# Patient Record
Sex: Male | Born: 1945 | Race: White | Hispanic: No | Marital: Married | Smoking: Never smoker
Health system: Southern US, Community
[De-identification: ages and names within clinical notes are randomized; demographics above are authoritative.]

## PROBLEM LIST (undated history)

## (undated) DIAGNOSIS — M1990 Unspecified osteoarthritis, unspecified site: Secondary | ICD-10-CM

## (undated) DIAGNOSIS — I499 Cardiac arrhythmia, unspecified: Secondary | ICD-10-CM

## (undated) DIAGNOSIS — IMO0001 Reserved for inherently not codable concepts without codable children: Secondary | ICD-10-CM

## (undated) DIAGNOSIS — Z9289 Personal history of other medical treatment: Secondary | ICD-10-CM

## (undated) DIAGNOSIS — G709 Myoneural disorder, unspecified: Secondary | ICD-10-CM

## (undated) DIAGNOSIS — K220 Achalasia of cardia: Secondary | ICD-10-CM

## (undated) HISTORY — PX: LAPAROSCOPIC MYOTOMY: SUR779

## (undated) HISTORY — PX: OTHER SURGICAL HISTORY: SHX169

---

## 2001-03-07 ENCOUNTER — Ambulatory Visit (HOSPITAL_COMMUNITY): Admission: RE | Admit: 2001-03-07 | Discharge: 2001-03-07 | Payer: Self-pay | Admitting: *Deleted

## 2001-03-07 ENCOUNTER — Encounter (INDEPENDENT_AMBULATORY_CARE_PROVIDER_SITE_OTHER): Payer: Self-pay | Admitting: *Deleted

## 2002-09-12 DIAGNOSIS — K220 Achalasia of cardia: Secondary | ICD-10-CM

## 2002-09-12 HISTORY — DX: Achalasia of cardia: K22.0

## 2007-05-18 ENCOUNTER — Ambulatory Visit (HOSPITAL_COMMUNITY): Admission: RE | Admit: 2007-05-18 | Discharge: 2007-05-18 | Payer: Self-pay | Admitting: Cardiology

## 2011-01-25 NOTE — Op Note (Signed)
Richard Rogers, Richard Rogers            ACCOUNT NO.:  [id number]   MEDICAL RECORD NO.:  [id number]          PATIENT TYPE:  OIB   LOCATION:  2859                         FACILITY:  [facility]   PHYSICIAN:  Armanda Magic, M.D.     DATE OF BIRTH:  1946/02/25   DATE OF PROCEDURE:  05/18/2007  DATE OF DISCHARGE:                               OPERATIVE REPORT   REFERRING PHYSICIAN:  Dr. Kirby Funk.   PROCEDURE:  Direct-current cardioversion.   OPERATOR:  Armanda Magic, M.D.   INDICATIONS:  Atrial fibrillation.   COMPLICATIONS:  None.   INTRAVENOUS MEDICATIONS:  Pentothal 175 mg IV.   INDICATION:  This is a 65 year old male with recent onset of A fib.  He  is now status post greater than 4 weeks of therapeutic INR greater than  2 and now presents for cardioversion.   DESCRIPTION OF PROCEDURE:  The patient was brought to the day hospital  in the fasting, nonsedated state.  Informed consent was obtained.  The  patient was connected to continuous heart rate and pulse oximetry  monitoring and intermittent blood pressure monitoring.  Defibrillation  pads were placed on the anterior and posterior left chest and back and  anesthesia was administered.  After adequate anesthesia was obtained, a  synchronized 120-joule biphasic shock was delivered, which successfully  converted the patient to sinus rhythm.  The patient tolerated the  procedure without complications.  The patient was monitored for a while  and once fully awake and alert, was discharged to home.   ASSESSMENT:  1. Atrial fibrillation.  2. Systemic anticoagulation with therapeutic INR.  3. Successful cardioversion to normal sinus rhythm.   PLAN:  Discharged to home after fully awake.  Follow up with me in 2  weeks.      Armanda Magic, M.D.  Electronically Signed    TT/MEDQ  D:  05/18/2007  T:  05/18/2007  Job:  [id number]   cc:   Thora Lance, M.D.

## 2015-01-11 DIAGNOSIS — Z9289 Personal history of other medical treatment: Secondary | ICD-10-CM

## 2015-01-11 HISTORY — DX: Personal history of other medical treatment: Z92.89

## 2015-06-01 ENCOUNTER — Other Ambulatory Visit: Payer: Self-pay | Admitting: Orthopedic Surgery

## 2015-06-26 ENCOUNTER — Other Ambulatory Visit (HOSPITAL_COMMUNITY): Payer: Self-pay

## 2015-06-26 ENCOUNTER — Encounter (HOSPITAL_COMMUNITY)
Admission: RE | Admit: 2015-06-26 | Discharge: 2015-06-26 | Disposition: A | Discharge status: Home / Self Care | Payer: No Typology Code available for payment source | Source: Ambulatory Visit | Attending: Orthopedic Surgery | Admitting: Orthopedic Surgery

## 2015-06-26 ENCOUNTER — Ambulatory Visit (HOSPITAL_COMMUNITY)
Admission: RE | Admit: 2015-06-26 | Discharge: 2015-06-26 | Disposition: A | Discharge status: Home / Self Care | Payer: No Typology Code available for payment source | Source: Ambulatory Visit | Attending: Orthopedic Surgery | Admitting: Orthopedic Surgery

## 2015-06-26 ENCOUNTER — Encounter (HOSPITAL_COMMUNITY): Payer: Self-pay

## 2015-06-26 DIAGNOSIS — K220 Achalasia of cardia: Secondary | ICD-10-CM | POA: Insufficient documentation

## 2015-06-26 DIAGNOSIS — Z01818 Encounter for other preprocedural examination: Secondary | ICD-10-CM

## 2015-06-26 DIAGNOSIS — Z01812 Encounter for preprocedural laboratory examination: Secondary | ICD-10-CM | POA: Diagnosis not present

## 2015-06-26 DIAGNOSIS — I4891 Unspecified atrial fibrillation: Secondary | ICD-10-CM | POA: Insufficient documentation

## 2015-06-26 DIAGNOSIS — M1711 Unilateral primary osteoarthritis, right knee: Secondary | ICD-10-CM | POA: Insufficient documentation

## 2015-06-26 HISTORY — DX: Unspecified osteoarthritis, unspecified site: M19.90

## 2015-06-26 HISTORY — DX: Achalasia of cardia: K22.0

## 2015-06-26 HISTORY — DX: Myoneural disorder, unspecified: G70.9

## 2015-06-26 HISTORY — DX: Personal history of other medical treatment: Z92.89

## 2015-06-26 HISTORY — DX: Cardiac arrhythmia, unspecified: I49.9

## 2015-06-26 HISTORY — DX: Reserved for inherently not codable concepts without codable children: IMO0001

## 2015-06-26 LAB — URINALYSIS, ROUTINE W REFLEX MICROSCOPIC
BILIRUBIN URINE: NEGATIVE
GLUCOSE, UA: NEGATIVE mg/dL
HGB URINE DIPSTICK: NEGATIVE
Ketones, ur: NEGATIVE mg/dL
Leukocytes, UA: NEGATIVE
Nitrite: NEGATIVE
PROTEIN: NEGATIVE mg/dL
SPECIFIC GRAVITY, URINE: 1.014 (ref 1.005–1.030)
UROBILINOGEN UA: 0.2 mg/dL (ref 0.0–1.0)
pH: 6 (ref 5.0–8.0)

## 2015-06-26 LAB — COMPREHENSIVE METABOLIC PANEL
ALK PHOS: 54 U/L (ref 38–126)
ALT: 25 U/L (ref 17–63)
ANION GAP: 7 (ref 5–15)
AST: 33 U/L (ref 15–41)
Albumin: 4.3 g/dL (ref 3.5–5.0)
BILIRUBIN TOTAL: 1.7 mg/dL — AB (ref 0.3–1.2)
BUN: 12 mg/dL (ref 6–20)
CALCIUM: 9.2 mg/dL (ref 8.9–10.3)
CO2: 27 mmol/L (ref 22–32)
Chloride: 104 mmol/L (ref 101–111)
Creatinine, Ser: 1.03 mg/dL (ref 0.61–1.24)
GFR calc Af Amer: >60 mL/min (ref >60)
Glucose, Bld: 114 mg/dL — ABNORMAL HIGH (ref 65–99)
POTASSIUM: 4.3 mmol/L (ref 3.5–5.1)
Sodium: 138 mmol/L (ref 135–145)
TOTAL PROTEIN: 7 g/dL (ref 6.5–8.1)

## 2015-06-26 LAB — CBC WITH DIFFERENTIAL/PLATELET
BASOS PCT: 1 %
Basophils Absolute: 0.1 10*3/uL (ref 0.0–0.1)
Eosinophils Absolute: 0.1 10*3/uL (ref 0.0–0.7)
Eosinophils Relative: 1 %
HEMATOCRIT: 46 % (ref 39.0–52.0)
HEMOGLOBIN: 15.2 g/dL (ref 13.0–17.0)
Lymphocytes Relative: 25 %
Lymphs Abs: 1.9 10*3/uL (ref 0.7–4.0)
MCH: 30.4 pg (ref 26.0–34.0)
MCHC: 33 g/dL (ref 30.0–36.0)
MCV: 92 fL (ref 78.0–100.0)
MONOS PCT: 8 %
Monocytes Absolute: 0.7 10*3/uL (ref 0.1–1.0)
NEUTROS ABS: 5.1 10*3/uL (ref 1.7–7.7)
NEUTROS PCT: 65 %
Platelets: 193 10*3/uL (ref 150–400)
RBC: 5 MIL/uL (ref 4.22–5.81)
RDW: 12.8 % (ref 11.5–15.5)
WBC: 7.8 10*3/uL (ref 4.0–10.5)

## 2015-06-26 LAB — SURGICAL PCR SCREEN
MRSA, PCR: NEGATIVE
Staphylococcus aureus: NEGATIVE

## 2015-06-26 LAB — PROTIME-INR
INR: 1.12 (ref 0.00–1.49)
PROTHROMBIN TIME: 14.6 s (ref 11.6–15.2)

## 2015-06-26 LAB — APTT: aPTT: 35 seconds (ref 24–37)

## 2015-06-26 NOTE — Pre-Procedure Instructions (Signed)
Richard Rogers  06/26/2015     No Pharmacies Listed   Your procedure is scheduled on 07/06/2015  Report to [facility] Admitting at 6:45 A.M.  Call this number if you have problems the morning of surgery:  [phone]   Remember:  Do not eat food or drink liquids after midnight.  Take these medicines the morning of surgery with A SIP OF WATER : Metoprolol   Do not wear jewelry   Do not wear lotions, powders, or perfumes.  You may wear deodorant.    Men may shave face and neck.   Do not bring valuables to the hospital.   [facility] is not responsible for any belongings or valuables.  Contacts, dentures or bridgework may not be worn into surgery.  Leave your suitcase in the car.  After surgery it may be brought to your room.  For patients admitted to the hospital, discharge time will be determined by your treatment team.  Patients discharged the day of surgery will not be allowed to drive home.   Name and phone number of your driver:   With spouse Special instructions:  Special Instructions: [facility] - Preparing for Surgery  Before surgery, you can play an important role.  Because skin is not sterile, your skin needs to be as free of germs as possible.  You can reduce the number of germs on you skin by washing with CHG (chlorahexidine gluconate) soap before surgery.  CHG is an antiseptic cleaner which kills germs and bonds with the skin to continue killing germs even after washing.  Please DO NOT use if you have an allergy to CHG or antibacterial soaps.  If your skin becomes reddened/irritated stop using the CHG and inform your nurse when you arrive at Short Stay.  Do not shave (including legs and underarms) for at least 48 hours prior to the first CHG shower.  You may shave your face.  Please follow these instructions carefully:   1.  Shower with CHG Soap the night before surgery and the  morning of Surgery.  2.  If you choose to wash your hair, wash  your hair first as usual with your  normal shampoo.  3.  After you shampoo, rinse your hair and body thoroughly to remove the  Shampoo.  4.  Use CHG as you would any other liquid soap.  You can apply chg directly to the skin and wash gently with scrungie or a clean washcloth.  5.  Apply the CHG Soap to your body ONLY FROM THE NECK DOWN.    Do not use on open wounds or open sores.  Avoid contact with your eyes, ears, mouth and genitals (private parts).  Wash genitals (private parts)   with your normal soap.  6.  Wash thoroughly, paying special attention to the area where your surgery will be performed.  7.  Thoroughly rinse your body with warm water from the neck down.  8.  DO NOT shower/wash with your normal soap after using and rinsing off   the CHG Soap.  9.  Pat yourself dry with a clean towel.            10.  Wear clean pajamas.            11.  Place clean sheets on your bed the night of your first shower and do not sleep with pets.  Day of Surgery  Do not apply any lotions/deodorants the morning of surgery.  Please wear clean clothes to the hospital/surgery center.  Please read over the following fact sheets that you were given. Pain Booklet, Coughing and Deep Breathing, MRSA Information and Surgical Site Infection Prevention

## 2015-06-26 NOTE — Progress Notes (Signed)
Call to A. Zelenak,PA-C, reported pt. H/O achalasia.  She will review the chart.

## 2015-06-28 LAB — URINE CULTURE: Culture: 6000

## 2015-06-29 NOTE — Progress Notes (Signed)
Anesthesia Chart Review:  Pt is 69 year old male scheduled for R total knee arthroplasty on 07/06/2015 with Dr. Ronnie Derby.   PCP is Dr. Fulton Reek at [facility].   PMH includes: atrial fibrillation, achalasia. Never smoker. BMI 26.   Medications include: ASA, metoprolol.   Preoperative labs reviewed.    Chest x-ray 06/26/2015 reviewed. No active cardiopulmonary disease.   EKG will need to be obtained DOS.   Nuclear stress test 01/14/2015:  -Regional wall motion: reveals normal myocardial thickening and wall  Motion. LVEF= 53% -The overall quality of the study is fair.  -Artifacts noted: no -Left ventricular cavity: normal. -Perfusion Analysis: SPECT images demonstrate homogeneous tracer distribution throughout the myocardium.   Pt has clearance for surgery from Dr. Doy Hutching in telephone encounter dated 01/19/15 in [place].   If EKG acceptable DOS, I anticipate pt can proceed as scheduled.   Willeen Cass, FNP-BC [facility] Short Stay Surgical Center/Anesthesiology Phone: [phone] 06/29/2015 2:48 PM

## 2015-07-05 MED ORDER — SODIUM CHLORIDE 0.9 % IV SOLN: INTRAVENOUS | Status: DC

## 2015-07-05 MED ORDER — SODIUM CHLORIDE 0.9 % IV SOLN
1000.0000 mg | INTRAVENOUS | Status: DC
  Filled 2015-07-05: qty 10

## 2015-07-05 MED ORDER — CHLORHEXIDINE GLUCONATE 4 % EX LIQD: 60.0000 mL | Freq: Once | CUTANEOUS | Status: DC

## 2015-07-05 MED ORDER — CEFAZOLIN SODIUM-DEXTROSE 2-3 GM-% IV SOLR
2.0000 g | INTRAVENOUS | Status: AC
  Administered 2015-07-06: 2 g via INTRAVENOUS

## 2015-07-05 MED ORDER — BUPIVACAINE LIPOSOME 1.3 % IJ SUSP
20.0000 mL | Freq: Once | INTRAMUSCULAR | Status: DC
  Filled 2015-07-05 (×2): qty 20

## 2015-07-06 ENCOUNTER — Encounter (HOSPITAL_COMMUNITY): Payer: Self-pay | Admitting: Anesthesiology

## 2015-07-06 ENCOUNTER — Inpatient Hospital Stay (HOSPITAL_COMMUNITY): Payer: Non-veteran care | Admitting: Anesthesiology

## 2015-07-06 ENCOUNTER — Inpatient Hospital Stay (HOSPITAL_COMMUNITY): Payer: Non-veteran care | Admitting: Vascular Surgery

## 2015-07-06 ENCOUNTER — Inpatient Hospital Stay (HOSPITAL_COMMUNITY)
Admission: RE | Admit: 2015-07-06 | Discharge: 2015-07-07 | DRG: 470 | Disposition: A | Discharge status: Home Health | Payer: Non-veteran care | Source: Ambulatory Visit | Attending: Orthopedic Surgery | Admitting: Orthopedic Surgery

## 2015-07-06 ENCOUNTER — Encounter (HOSPITAL_COMMUNITY)
Admission: RE | Disposition: A | Discharge status: Home Health | Payer: Self-pay | Source: Ambulatory Visit | Attending: Orthopedic Surgery

## 2015-07-06 DIAGNOSIS — D62 Acute posthemorrhagic anemia: Secondary | ICD-10-CM | POA: Diagnosis not present

## 2015-07-06 DIAGNOSIS — M1711 Unilateral primary osteoarthritis, right knee: Secondary | ICD-10-CM | POA: Diagnosis present

## 2015-07-06 DIAGNOSIS — Z96659 Presence of unspecified artificial knee joint: Secondary | ICD-10-CM

## 2015-07-06 DIAGNOSIS — M25561 Pain in right knee: Secondary | ICD-10-CM | POA: Diagnosis present

## 2015-07-06 HISTORY — PX: TOTAL KNEE ARTHROPLASTY: SHX125

## 2015-07-06 LAB — CBC
HCT: 42.5 % (ref 39.0–52.0)
Hemoglobin: 14 g/dL (ref 13.0–17.0)
MCH: 30.2 pg (ref 26.0–34.0)
MCHC: 32.9 g/dL (ref 30.0–36.0)
MCV: 91.8 fL (ref 78.0–100.0)
PLATELETS: 191 10*3/uL (ref 150–400)
RBC: 4.63 MIL/uL (ref 4.22–5.81)
RDW: 12.6 % (ref 11.5–15.5)
WBC: 9.9 10*3/uL (ref 4.0–10.5)

## 2015-07-06 LAB — POCT I-STAT 4, (NA,K, GLUC, HGB,HCT)
Glucose, Bld: 107 mg/dL — ABNORMAL HIGH (ref 65–99)
HEMATOCRIT: 43 % (ref 39.0–52.0)
HEMOGLOBIN: 14.6 g/dL (ref 13.0–17.0)
Potassium: 4.2 mmol/L (ref 3.5–5.1)
SODIUM: 141 mmol/L (ref 135–145)

## 2015-07-06 LAB — CREATININE, SERUM: Creatinine, Ser: 0.91 mg/dL (ref 0.61–1.24)

## 2015-07-06 SURGERY — ARTHROPLASTY, KNEE, TOTAL
Anesthesia: Spinal | Site: Knee | Laterality: Right

## 2015-07-06 MED ORDER — ONDANSETRON HCL 4 MG/2ML IJ SOLN
INTRAMUSCULAR | Status: AC
  Filled 2015-07-06: qty 2

## 2015-07-06 MED ORDER — BUPIVACAINE-EPINEPHRINE (PF) 0.5% -1:200000 IJ SOLN
INTRAMUSCULAR | Status: AC
  Filled 2015-07-06: qty 30

## 2015-07-06 MED ORDER — 0.9 % SODIUM CHLORIDE (POUR BTL) OPTIME
TOPICAL | Status: DC | PRN
  Administered 2015-07-06: 1000 mL

## 2015-07-06 MED ORDER — SENNOSIDES-DOCUSATE SODIUM 8.6-50 MG PO TABS: 1.0000 | ORAL_TABLET | Freq: Every evening | ORAL | Status: DC | PRN

## 2015-07-06 MED ORDER — ROCURONIUM BROMIDE 50 MG/5ML IV SOLN
INTRAVENOUS | Status: AC
  Filled 2015-07-06: qty 1

## 2015-07-06 MED ORDER — LACTATED RINGERS IV SOLN
INTRAVENOUS | Status: DC | PRN
  Administered 2015-07-06 (×2): via INTRAVENOUS

## 2015-07-06 MED ORDER — DIPHENHYDRAMINE HCL 12.5 MG/5ML PO ELIX: 12.5000 mg | ORAL_SOLUTION | ORAL | Status: DC | PRN

## 2015-07-06 MED ORDER — PROPOFOL 500 MG/50ML IV EMUL
INTRAVENOUS | Status: DC | PRN
  Administered 2015-07-06: 50 ug/kg/min via INTRAVENOUS

## 2015-07-06 MED ORDER — SODIUM CHLORIDE 0.9 % IJ SOLN
INTRAMUSCULAR | Status: AC
  Filled 2015-07-06: qty 10

## 2015-07-06 MED ORDER — PROPOFOL 10 MG/ML IV BOLUS
INTRAVENOUS | Status: AC
  Filled 2015-07-06: qty 20

## 2015-07-06 MED ORDER — SODIUM CHLORIDE 0.9 % IR SOLN
Status: DC | PRN
  Administered 2015-07-06: 3000 mL

## 2015-07-06 MED ORDER — MIDAZOLAM HCL 5 MG/5ML IJ SOLN
INTRAMUSCULAR | Status: DC | PRN
  Administered 2015-07-06 (×2): 1 mg via INTRAVENOUS

## 2015-07-06 MED ORDER — BUPIVACAINE LIPOSOME 1.3 % IJ SUSP
INTRAMUSCULAR | Status: DC | PRN
  Administered 2015-07-06: 20 mL

## 2015-07-06 MED ORDER — FENTANYL CITRATE (PF) 250 MCG/5ML IJ SOLN
INTRAMUSCULAR | Status: AC
  Filled 2015-07-06: qty 5

## 2015-07-06 MED ORDER — LIDOCAINE HCL (CARDIAC) 20 MG/ML IV SOLN
INTRAVENOUS | Status: AC
  Filled 2015-07-06: qty 5

## 2015-07-06 MED ORDER — ENOXAPARIN SODIUM 30 MG/0.3ML ~~LOC~~ SOLN
30.0000 mg | Freq: Two times a day (BID) | SUBCUTANEOUS | Status: DC
  Administered 2015-07-07: 30 mg via SUBCUTANEOUS
  Filled 2015-07-06: qty 0.3

## 2015-07-06 MED ORDER — BUPIVACAINE HCL (PF) 0.75 % IJ SOLN
INTRAMUSCULAR | Status: DC | PRN
Start: 2015-07-06 — End: 2015-07-06
  Administered 2015-07-06: 2 mL via INTRATHECAL

## 2015-07-06 MED ORDER — HYDROMORPHONE HCL 1 MG/ML IJ SOLN: 1.0000 mg | INTRAMUSCULAR | Status: DC | PRN

## 2015-07-06 MED ORDER — ONDANSETRON HCL 4 MG PO TABS: 4.0000 mg | ORAL_TABLET | Freq: Four times a day (QID) | ORAL | Status: DC | PRN

## 2015-07-06 MED ORDER — METHOCARBAMOL 1000 MG/10ML IJ SOLN
500.0000 mg | Freq: Four times a day (QID) | INTRAVENOUS | Status: DC | PRN
  Filled 2015-07-06: qty 5

## 2015-07-06 MED ORDER — OXYCODONE HCL 5 MG PO TABS
5.0000 mg | ORAL_TABLET | ORAL | Status: DC | PRN
  Administered 2015-07-06 – 2015-07-07 (×3): 10 mg via ORAL
  Filled 2015-07-06 (×3): qty 2

## 2015-07-06 MED ORDER — PHENYLEPHRINE 40 MCG/ML (10ML) SYRINGE FOR IV PUSH (FOR BLOOD PRESSURE SUPPORT)
PREFILLED_SYRINGE | INTRAVENOUS | Status: AC
  Filled 2015-07-06: qty 10

## 2015-07-06 MED ORDER — SUCCINYLCHOLINE CHLORIDE 20 MG/ML IJ SOLN
INTRAMUSCULAR | Status: AC
  Filled 2015-07-06: qty 1

## 2015-07-06 MED ORDER — OXYCODONE HCL ER 10 MG PO T12A
10.0000 mg | EXTENDED_RELEASE_TABLET | Freq: Two times a day (BID) | ORAL | Status: DC
  Administered 2015-07-06 (×2): 10 mg via ORAL
  Filled 2015-07-06 (×2): qty 1

## 2015-07-06 MED ORDER — CEFAZOLIN SODIUM-DEXTROSE 2-3 GM-% IV SOLR
2.0000 g | Freq: Four times a day (QID) | INTRAVENOUS | Status: AC
Start: 2015-07-06 — End: 2015-07-06
  Administered 2015-07-06 (×2): 2 g via INTRAVENOUS
  Filled 2015-07-06 (×2): qty 50

## 2015-07-06 MED ORDER — PHENYLEPHRINE HCL 10 MG/ML IJ SOLN
INTRAMUSCULAR | Status: DC | PRN
  Administered 2015-07-06 (×3): 80 ug via INTRAVENOUS
  Administered 2015-07-06 (×2): 40 ug via INTRAVENOUS
  Administered 2015-07-06 (×2): 80 ug via INTRAVENOUS

## 2015-07-06 MED ORDER — METOCLOPRAMIDE HCL 5 MG PO TABS: 5.0000 mg | ORAL_TABLET | Freq: Three times a day (TID) | ORAL | Status: DC | PRN

## 2015-07-06 MED ORDER — LACTATED RINGERS IV SOLN: INTRAVENOUS | Status: DC

## 2015-07-06 MED ORDER — ALUM & MAG HYDROXIDE-SIMETH 200-200-20 MG/5ML PO SUSP: 30.0000 mL | ORAL | Status: DC | PRN

## 2015-07-06 MED ORDER — HYDROMORPHONE HCL 1 MG/ML IJ SOLN: 0.2500 mg | INTRAMUSCULAR | Status: DC | PRN

## 2015-07-06 MED ORDER — PROMETHAZINE HCL 25 MG/ML IJ SOLN: 6.2500 mg | INTRAMUSCULAR | Status: DC | PRN

## 2015-07-06 MED ORDER — PHENOL 1.4 % MT LIQD: 1.0000 | OROMUCOSAL | Status: DC | PRN

## 2015-07-06 MED ORDER — ONDANSETRON HCL 4 MG/2ML IJ SOLN
4.0000 mg | Freq: Four times a day (QID) | INTRAMUSCULAR | Status: DC | PRN
  Administered 2015-07-06: 4 mg via INTRAVENOUS
  Filled 2015-07-06: qty 2

## 2015-07-06 MED ORDER — DOCUSATE SODIUM 100 MG PO CAPS
100.0000 mg | ORAL_CAPSULE | Freq: Two times a day (BID) | ORAL | Status: DC
  Administered 2015-07-06 – 2015-07-07 (×3): 100 mg via ORAL
  Filled 2015-07-06 (×3): qty 1

## 2015-07-06 MED ORDER — TRANEXAMIC ACID 1000 MG/10ML IV SOLN
2000.0000 mg | INTRAVENOUS | Status: AC
  Administered 2015-07-06: 2000 mg via TOPICAL
  Filled 2015-07-06: qty 20

## 2015-07-06 MED ORDER — SODIUM CHLORIDE 0.9 % IV SOLN
INTRAVENOUS | Status: DC
  Administered 2015-07-06: 13:00:00 via INTRAVENOUS

## 2015-07-06 MED ORDER — CELECOXIB 200 MG PO CAPS
200.0000 mg | ORAL_CAPSULE | Freq: Two times a day (BID) | ORAL | Status: DC
  Filled 2015-07-06 (×2): qty 1

## 2015-07-06 MED ORDER — ACETAMINOPHEN 325 MG PO TABS: 650.0000 mg | ORAL_TABLET | Freq: Four times a day (QID) | ORAL | Status: DC | PRN

## 2015-07-06 MED ORDER — METOCLOPRAMIDE HCL 5 MG/ML IJ SOLN
5.0000 mg | Freq: Three times a day (TID) | INTRAMUSCULAR | Status: DC | PRN
  Filled 2015-07-06: qty 2

## 2015-07-06 MED ORDER — METHOCARBAMOL 500 MG PO TABS
500.0000 mg | ORAL_TABLET | Freq: Four times a day (QID) | ORAL | Status: DC | PRN
  Filled 2015-07-06: qty 1

## 2015-07-06 MED ORDER — METOPROLOL SUCCINATE ER 50 MG PO TB24
75.0000 mg | ORAL_TABLET | Freq: Every day | ORAL | Status: DC
  Administered 2015-07-07: 75 mg via ORAL
  Filled 2015-07-06 (×4): qty 1

## 2015-07-06 MED ORDER — MEPERIDINE HCL 25 MG/ML IJ SOLN: 6.2500 mg | INTRAMUSCULAR | Status: DC | PRN

## 2015-07-06 MED ORDER — SODIUM CHLORIDE 0.9 % IJ SOLN
INTRAMUSCULAR | Status: DC | PRN
  Administered 2015-07-06 (×2): 10 mL via INTRAVENOUS

## 2015-07-06 MED ORDER — FLEET ENEMA 7-19 GM/118ML RE ENEM: 1.0000 | ENEMA | Freq: Once | RECTAL | Status: DC | PRN

## 2015-07-06 MED ORDER — EPHEDRINE SULFATE 50 MG/ML IJ SOLN
INTRAMUSCULAR | Status: AC
  Filled 2015-07-06: qty 1

## 2015-07-06 MED ORDER — FENTANYL CITRATE (PF) 100 MCG/2ML IJ SOLN
INTRAMUSCULAR | Status: DC | PRN
  Administered 2015-07-06: 25 ug via INTRAVENOUS

## 2015-07-06 MED ORDER — ONDANSETRON HCL 4 MG/2ML IJ SOLN
INTRAMUSCULAR | Status: DC | PRN
  Administered 2015-07-06: 4 mg via INTRAVENOUS

## 2015-07-06 MED ORDER — BISACODYL 5 MG PO TBEC: 5.0000 mg | DELAYED_RELEASE_TABLET | Freq: Every day | ORAL | Status: DC | PRN

## 2015-07-06 MED ORDER — BUPIVACAINE-EPINEPHRINE 0.5% -1:200000 IJ SOLN
INTRAMUSCULAR | Status: DC | PRN
  Administered 2015-07-06 (×2): 30 mL

## 2015-07-06 MED ORDER — MIDAZOLAM HCL 2 MG/2ML IJ SOLN
INTRAMUSCULAR | Status: AC
  Filled 2015-07-06: qty 4

## 2015-07-06 MED ORDER — LACTATED RINGERS IV SOLN
INTRAVENOUS | Status: DC
  Administered 2015-07-06: 08:00:00 via INTRAVENOUS

## 2015-07-06 MED ORDER — ZOLPIDEM TARTRATE 5 MG PO TABS: 5.0000 mg | ORAL_TABLET | Freq: Every evening | ORAL | Status: DC | PRN

## 2015-07-06 MED ORDER — MENTHOL 3 MG MT LOZG: 1.0000 | LOZENGE | OROMUCOSAL | Status: DC | PRN

## 2015-07-06 MED ORDER — ACETAMINOPHEN 650 MG RE SUPP: 650.0000 mg | Freq: Four times a day (QID) | RECTAL | Status: DC | PRN

## 2015-07-06 SURGICAL SUPPLY — 62 items
BANDAGE ESMARK 6X9 LF (GAUZE/BANDAGES/DRESSINGS) ×1 IMPLANT
BLADE SAGITTAL 13X1.27X60 (BLADE) ×2 IMPLANT
BLADE SAGITTAL 13X1.27X60MM (BLADE) ×1
BLADE SAW SGTL 83.5X18.5 (BLADE) ×3 IMPLANT
BLADE SURG 10 STRL SS (BLADE) ×3 IMPLANT
BNDG ESMARK 6X9 LF (GAUZE/BANDAGES/DRESSINGS) ×3
BOWL SMART MIX CTS (DISPOSABLE) ×3 IMPLANT
CAPT KNEE TOTAL 3 ×3 IMPLANT
CEMENT BONE SIMPLEX SPEEDSET (Cement) ×6 IMPLANT
COVER SURGICAL LIGHT HANDLE (MISCELLANEOUS) ×3 IMPLANT
CUFF TOURNIQUET SINGLE 34IN LL (TOURNIQUET CUFF) ×3 IMPLANT
DECANTER SPIKE VIAL GLASS SM (MISCELLANEOUS) ×3 IMPLANT
DRAPE EXTREMITY T 121X128X90 (DRAPE) ×3 IMPLANT
DRAPE INCISE IOBAN 66X45 STRL (DRAPES) ×6 IMPLANT
DRAPE PROXIMA HALF (DRAPES) IMPLANT
DRAPE U-SHAPE 47X51 STRL (DRAPES) ×3 IMPLANT
DRSG ADAPTIC 3X8 NADH LF (GAUZE/BANDAGES/DRESSINGS) ×3 IMPLANT
DRSG PAD ABDOMINAL 8X10 ST (GAUZE/BANDAGES/DRESSINGS) ×3 IMPLANT
DURAPREP 26ML APPLICATOR (WOUND CARE) ×6 IMPLANT
ELECT REM PT RETURN 9FT ADLT (ELECTROSURGICAL) ×3
ELECTRODE REM PT RTRN 9FT ADLT (ELECTROSURGICAL) ×1 IMPLANT
GAUZE SPONGE 4X4 12PLY STRL (GAUZE/BANDAGES/DRESSINGS) ×3 IMPLANT
GLOVE BIOGEL M 7.0 STRL (GLOVE) ×6 IMPLANT
GLOVE BIOGEL PI IND STRL 7.5 (GLOVE) ×1 IMPLANT
GLOVE BIOGEL PI IND STRL 8 (GLOVE) ×1 IMPLANT
GLOVE BIOGEL PI IND STRL 8.5 (GLOVE) ×2 IMPLANT
GLOVE BIOGEL PI INDICATOR 7.5 (GLOVE) ×2
GLOVE BIOGEL PI INDICATOR 8 (GLOVE) ×2
GLOVE BIOGEL PI INDICATOR 8.5 (GLOVE) ×4
GLOVE SURG ORTHO 8.0 STRL STRW (GLOVE) ×6 IMPLANT
GLOVE SURG SS PI 8.5 STRL IVOR (GLOVE) ×4
GLOVE SURG SS PI 8.5 STRL STRW (GLOVE) ×2 IMPLANT
GOWN STRL REUS W/ TWL LRG LVL3 (GOWN DISPOSABLE) ×1 IMPLANT
GOWN STRL REUS W/ TWL XL LVL3 (GOWN DISPOSABLE) ×2 IMPLANT
GOWN STRL REUS W/TWL LRG LVL3 (GOWN DISPOSABLE) ×2
GOWN STRL REUS W/TWL XL LVL3 (GOWN DISPOSABLE) ×4
HANDPIECE INTERPULSE COAX TIP (DISPOSABLE) ×2
HOOD PEEL AWAY FACE SHEILD DIS (HOOD) ×12 IMPLANT
KIT BASIN OR (CUSTOM PROCEDURE TRAY) ×3 IMPLANT
KIT ROOM TURNOVER OR (KITS) ×3 IMPLANT
KNEE CAPITATED TOTAL 3 ×1 IMPLANT
MANIFOLD NEPTUNE II (INSTRUMENTS) ×3 IMPLANT
NEEDLE 22X1 1/2 (OR ONLY) (NEEDLE) ×6 IMPLANT
NS IRRIG 1000ML POUR BTL (IV SOLUTION) ×3 IMPLANT
PACK TOTAL JOINT (CUSTOM PROCEDURE TRAY) ×3 IMPLANT
PACK UNIVERSAL I (CUSTOM PROCEDURE TRAY) ×3 IMPLANT
PAD ARMBOARD 7.5X6 YLW CONV (MISCELLANEOUS) ×6 IMPLANT
PADDING CAST COTTON 6X4 STRL (CAST SUPPLIES) ×3 IMPLANT
SET HNDPC FAN SPRY TIP SCT (DISPOSABLE) ×1 IMPLANT
STAPLER VISISTAT 35W (STAPLE) ×3 IMPLANT
SUCTION FRAZIER TIP 10 FR DISP (SUCTIONS) ×3 IMPLANT
SUT BONE WAX W31G (SUTURE) ×3 IMPLANT
SUT VIC AB 0 CTB1 27 (SUTURE) ×6 IMPLANT
SUT VIC AB 1 CT1 27 (SUTURE) ×4
SUT VIC AB 1 CT1 27XBRD ANBCTR (SUTURE) ×2 IMPLANT
SUT VIC AB 2-0 CT1 27 (SUTURE) ×4
SUT VIC AB 2-0 CT1 TAPERPNT 27 (SUTURE) ×2 IMPLANT
SYR 20CC LL (SYRINGE) ×6 IMPLANT
TOWEL OR 17X24 6PK STRL BLUE (TOWEL DISPOSABLE) ×3 IMPLANT
TOWEL OR 17X26 10 PK STRL BLUE (TOWEL DISPOSABLE) ×3 IMPLANT
TRAY CATH 16FR W/PLASTIC CATH (SET/KITS/TRAYS/PACK) ×3 IMPLANT
WATER STERILE IRR 1000ML POUR (IV SOLUTION) ×6 IMPLANT

## 2015-07-06 NOTE — Progress Notes (Signed)
SPORTS MEDICINE AND JOINT REPLACEMENT  Lara Mulch, MD   Carlynn Spry, PA-C [address], [place], [place]  [address]                             [phone]   PROGRESS NOTE  Subjective:  negative for Chest Pain  negative for Shortness of Breath  negative for Nausea/Vomiting   negative for Calf Pain  negative for Bowel Movement   Tolerating Diet: yes         Patient reports pain as 5 on 0-10 scale.    Objective: Vital signs in last 24 hours:   Patient Vitals for the past 24 hrs:  BP Temp Temp src Pulse Resp SpO2 Weight  07/06/15 1213 129/85 mmHg 97.5 F (36.4 C) Oral (!) 52 14 100 % -  07/06/15 1158 122/81 mmHg 97.5 F (36.4 C) - (!) 55 13 98 % -  07/06/15 1145 126/81 mmHg - - 76 13 97 % -  07/06/15 1130 115/83 mmHg - - 64 11 98 % -  07/06/15 1115 112/79 mmHg - - 79 14 96 % -  07/06/15 1103 115/84 mmHg 97.4 F (36.3 C) - 83 12 98 % -  07/06/15 0706 - - - - - - 92.08 kg (203 lb)  07/06/15 0704 (!) 148/94 mmHg 97.9 F (36.6 C) Oral (!) 102 20 99 % -    @flow {1959:LAST@   Intake/Output from previous day:       Intake/Output this shift:   10/24 0701 - 10/24 1900 In: 1200 [I.V.:1200] Out: 675 [Urine:600]   Intake/Output      10/23 0701 - 10/24 0700 10/24 0701 - 10/25 0700   I.V. (mL/kg)  1200 (13)   Total Intake(mL/kg)  1200 (13)   Urine (mL/kg/hr)  600 (0.6)   Blood  75 (0.1)   Total Output   675   Net   +525           LABORATORY DATA:  Recent Labs  07/06/15 0807 07/06/15 1504  WBC  --  9.9  HGB 14.6 14.0  HCT 43.0 42.5  PLT  --  191    Recent Labs  07/06/15 0807 07/06/15 1504  NA 141  --   K 4.2  --   CREATININE  --  0.91  GLUCOSE 107*  --    Lab Results  Component Value Date   INR 1.12 06/26/2015   INR 2.3* 05/18/2007    Examination:  General appearance: alert, cooperative and no distress Extremities: Homans sign is negative, no sign of DVT  Wound Exam: clean, dry, intact   Drainage:  None: wound tissue dry  Motor  Exam: EHL and FHL Intact  Sensory Exam: Deep Peroneal normal   Assessment:    Day of Surgery  Procedure(s) (LRB): RIGHT TOTAL KNEE ARTHROPLASTY (Right)  ADDITIONAL DIAGNOSIS:  Active Problems:   S/P total knee arthroplasty  Acute Blood Loss Anemia   Plan: Physical Therapy as ordered Weight Bearing as Tolerated (WBAT)  DVT Prophylaxis:  Lovenox  DISCHARGE PLAN: Home  DISCHARGE NEEDS: HHPT, CPM, Walker and 3-in-1 comode seat         Raelie Lohr 07/06/2015, 5:59 PM

## 2015-07-06 NOTE — Anesthesia Procedure Notes (Addendum)
Spinal Patient location during procedure: OR Start time: 07/06/2015 8:47 AM End time: 07/06/2015 8:49 AM Staffing Anesthesiologist: Suella Broad D Performed by: anesthesiologist  Preanesthetic Checklist Completed: patient identified, site marked, surgical consent, pre-op evaluation, timeout performed, IV checked, risks and benefits discussed and monitors and equipment checked Spinal Block Patient position: sitting Prep: ChloraPrep Patient monitoring: heart rate, continuous pulse ox, blood pressure and cardiac monitor Approach: midline Location: L4-5 Injection technique: single-shot Needle Needle type: Whitacre and Introducer  Needle gauge: 24 G Needle length: 9 cm Additional Notes Negative paresthesia. Negative blood return. Positive free-flowing CSF. Expiration date of kit checked and confirmed. Patient tolerated procedure well, without complications.    Procedure Name: MAC Date/Time: 07/06/2015 8:54 AM Performed by: Susa Loffler Pre-anesthesia Checklist: Patient identified, Emergency Drugs available, Suction available, Patient being monitored and Timeout performed Patient Re-evaluated:Patient Re-evaluated prior to inductionOxygen Delivery Method: Simple face mask Dental Injury: Teeth and Oropharynx as per pre-operative assessment

## 2015-07-06 NOTE — Transfer of Care (Signed)
Immediate Anesthesia Transfer of Care Note  Patient: Richard Rogers  Procedure(s) Performed: Procedure(s): RIGHT TOTAL KNEE ARTHROPLASTY (Right)  Patient Location: PACU  Anesthesia Type:MAC and Spinal  Level of Consciousness: awake, alert  and oriented  Airway & Oxygen Therapy: Patient Spontanous Breathing  Post-op Assessment: Report given to RN and Post -op Vital signs reviewed and stable  Post vital signs: Reviewed and stable  Last Vitals:  Filed Vitals:   07/06/15 0704  BP: 148/94  Pulse: 102  Temp: 36.6 C  Resp: 20    Complications: No apparent anesthesia complications

## 2015-07-06 NOTE — Anesthesia Preprocedure Evaluation (Addendum)
Anesthesia Evaluation  Patient identified by MRN, date of birth, ID band Patient awake    Reviewed: Allergy & Precautions, NPO status , Patient's Chart, lab work & pertinent test results, reviewed documented beta blocker date and time   Airway Mallampati: II  TM Distance: >3 FB Neck ROM: Full    Dental  (+) Teeth Intact   Pulmonary    breath sounds clear to auscultation       Cardiovascular + dysrhythmias Atrial Fibrillation  Rhythm:Irregular Rate:Abnormal     Neuro/Psych  Neuromuscular disease negative psych ROS   GI/Hepatic negative GI ROS, Neg liver ROS,   Endo/Other  negative endocrine ROS  Renal/GU negative Renal ROS  negative genitourinary   Musculoskeletal  (+) Arthritis , Osteoarthritis,    Abdominal   Peds  Hematology negative hematology ROS (+)   Anesthesia Other Findings   Reproductive/Obstetrics negative OB ROS                            Lab Results  Component Value Date   WBC 7.8 06/26/2015   HGB 15.2 06/26/2015   HCT 46.0 06/26/2015   MCV 92.0 06/26/2015   PLT 193 06/26/2015   Lab Results  Component Value Date   CREATININE 1.03 06/26/2015   BUN 12 06/26/2015   NA 138 06/26/2015   K 4.3 06/26/2015   CL 104 06/26/2015   CO2 27 06/26/2015   Lab Results  Component Value Date   INR 1.12 06/26/2015   INR 2.3* 05/18/2007   EKG: atrial fibrillation, rate 80's.  Anesthesia Physical Anesthesia Plan  ASA: II  Anesthesia Plan: Spinal   Post-op Pain Management:    Induction: Intravenous  Airway Management Planned: Natural Airway and Nasal Cannula  Additional Equipment:   Intra-op Plan:   Post-operative Plan:   Informed Consent: I have reviewed the patients History and Physical, chart, labs and discussed the procedure including the risks, benefits and alternatives for the proposed anesthesia with the patient or authorized representative who has indicated  his/her understanding and acceptance.   Dental advisory given  Plan Discussed with: CRNA  Anesthesia Plan Comments:         Anesthesia Quick Evaluation

## 2015-07-06 NOTE — Evaluation (Signed)
Physical Therapy Evaluation Patient Details Name: Richard Rogers MRN: [id number] DOB: Sep 14, 1945 Today's Date: 07/06/2015   History of Present Illness  69 y.o. male presented with a history of pain in the right knee now s/p Rt TKA. PMH: dysrhythmia (a.fib)  Clinical Impression  Pt is s/p TKA resulting in the deficits listed below (see PT Problem List). Pt will benefit from skilled PT to increase their independence and safety with mobility to allow discharge to home with assistance from spouse. Patient able to ambulate 12 feet with rw and min guard assistance, lacking active Rt ankle dorsiflexion at this time. Will progress as tolerated.        Follow Up Recommendations Home health PT;Supervision for mobility/OOB    Equipment Recommendations  None recommended by PT;Other (comment) (patient reports having rw at home)    Recommendations for Other Services       Precautions / Restrictions Precautions Precautions: Knee;Fall Precaution Booklet Issued: Yes (comment) Precaution Comments: HEP provided, reviewed no pillow under knee Restrictions Weight Bearing Restrictions: Yes RLE Weight Bearing: Weight bearing as tolerated      Mobility  Bed Mobility Overal bed mobility: Needs Assistance Bed Mobility: Supine to Sit     Supine to sit: HOB elevated;Min guard        Transfers Overall transfer level: Needs assistance Equipment used: Rolling walker (2 wheeled) Transfers: Sit to/from Stand Sit to Stand: Min guard         General transfer comment: cues for hand position  Ambulation/Gait Ambulation/Gait assistance: Min guard Ambulation Distance (Feet): 12 Feet Assistive device: Rolling walker (2 wheeled) Gait Pattern/deviations: Step-to pattern;Decreased dorsiflexion - right;Decreased stance time - right;Decreased weight shift to right Gait velocity: decerased   General Gait Details: No active dorsiflexion a this time on Rt.   Stairs            Wheelchair  Mobility    Modified Rankin (Stroke Patients Only)       Balance Overall balance assessment: Needs assistance Sitting-balance support: No upper extremity supported Sitting balance-Leahy Scale: Fair     Standing balance support: Bilateral upper extremity supported Standing balance-Leahy Scale: Poor Standing balance comment: using rw                             Pertinent Vitals/Pain Pain Assessment: 0-10 Pain Score: 3  Pain Location: Rt knee Pain Descriptors / Indicators: Sore Pain Intervention(s): Limited activity within patient's tolerance;Monitored during session;Ice applied    Home Living Family/patient expects to be discharged to:: Private residence Living Arrangements: Spouse/significant other Available Help at Discharge: Family;Available 24 hours/day Type of Home: House Home Access: Stairs to enter Entrance Stairs-Rails: Right Entrance Stairs-Number of Steps: 3 Home Layout: One level Home Equipment: Walker - 2 wheels      Prior Function Level of Independence: Independent               Hand Dominance        Extremity/Trunk Assessment               Lower Extremity Assessment: RLE deficits/detail RLE Deficits / Details: able to perform SLR       Communication   Communication: No difficulties  Cognition Arousal/Alertness: Awake/alert Behavior During Therapy: WFL for tasks assessed/performed Overall Cognitive Status: Within Functional Limits for tasks assessed                      General Comments  Exercises Total Joint Exercises Ankle Circles/Pumps: AROM;Both;10 reps Quad Sets: Strengthening;Right;10 reps Gluteal Sets: Strengthening;Both;10 reps      Assessment/Plan    PT Assessment Patient needs continued PT services  PT Diagnosis Difficulty walking   PT Problem List Decreased strength;Decreased range of motion;Decreased activity tolerance;Decreased balance;Decreased mobility;Pain  PT Treatment  Interventions DME instruction;Gait training;Stair training;Functional mobility training;Therapeutic activities;Therapeutic exercise;Balance training;Patient/family education   PT Goals (Current goals can be found in the Care Plan section) Acute Rehab PT Goals Patient Stated Goal: go home from hospital PT Goal Formulation: With patient Time For Goal Achievement: 07/20/15 Potential to Achieve Goals: Good    Frequency 7X/week   Barriers to discharge        Co-evaluation               End of Session Equipment Utilized During Treatment: Gait belt Activity Tolerance: Patient tolerated treatment well Patient left: in chair;with call bell/phone within reach;with family/visitor present;Other (comment) (in bone foam) Nurse Communication: Mobility status         Time: 8309-4076 PT Time Calculation (min) (ACUTE ONLY): 40 min   Charges:   PT Evaluation $Initial PT Evaluation Tier I: 1 Procedure PT Treatments $Therapeutic Activity: 23-37 mins   PT G Codes:        Cassell Clement, PT, CSCS Pager [phone] Office [phone]  07/06/2015, 3:35 PM

## 2015-07-06 NOTE — Progress Notes (Signed)
Orthopedic Tech Progress Note Patient Details:  [name] 07/17/1946 [id number]  CPM Right Knee CPM Right Knee: On Right Knee Flexion (Degrees): 90 Right Knee Extension (Degrees): 0 Additional Comments: trapeze bar patient helper Viewed order from doctor's order list  Hildred Priest 07/06/2015, 11:28 AM

## 2015-07-06 NOTE — Progress Notes (Signed)
Utilization review completed.  

## 2015-07-06 NOTE — Anesthesia Postprocedure Evaluation (Signed)
  Anesthesia Post-op Note  Patient: Richard Rogers  Procedure(s) Performed: Procedure(s): RIGHT TOTAL KNEE ARTHROPLASTY (Right)  Patient Location: PACU  Anesthesia Type:Spinal  Level of Consciousness: awake, alert  and oriented  Airway and Oxygen Therapy: Patient Spontanous Breathing  Post-op Pain: none  Post-op Assessment: Post-op Vital signs reviewed and Patient's Cardiovascular Status Stable LLE Motor Response: Purposeful movement (can bend knee up and put foot on bed)   RLE Motor Response: Purposeful movement, Responds to commands (wiggle toes) RLE Sensation: Tingling L Sensory Level:  (can feel footpump on bottom of foot, can't feel toes) R Sensory Level: L5-Outer lower leg, top of foot, great toe  Post-op Vital Signs: Reviewed and stable  Last Vitals:  Filed Vitals:   07/06/15 1213  BP: 129/85  Pulse: 52  Temp: 36.4 C  Resp: 14    Complications: No apparent anesthesia complications

## 2015-07-06 NOTE — Progress Notes (Signed)
Orthopedic Tech Progress Note Patient Details:  Richard Rogers [birth year] [id number] On cpm at 7:10 pm Patient ID: Richard Rogers, male   DOB: [birth year], 69 y.o.   MRN: [id number]   Richard Rogers 07/06/2015, 7:10 PM

## 2015-07-06 NOTE — H&P (Signed)
  Richard Rogers MRN:  [id number] DOB/SEX:  [birth year]/male  CHIEF COMPLAINT:  Painful right Knee  HISTORY: Patient is a 69 y.o. male presented with a history of pain in the right knee. Onset of symptoms was gradual starting several years ago with gradually worsening course since that time. Prior procedures on the knee include none. Patient has been treated conservatively with over-the-counter NSAIDs and activity modification. Patient currently rates pain in the knee at 10 out of 10 with activity. There is pain at night.  PAST MEDICAL HISTORY: There are no active problems to display for this patient.  Past Medical History  Diagnosis Date  . Achalasia of esophagus 2004    procedure for correction done at [facility]-    . Dysrhythmia     afib  . H/O cardiovascular stress test 01/2015    done in preparation for knee replacement surg.  Marland Kitchen Shortness of breath dyspnea     when in afib  . Neuromuscular disorder (HCC)     hamstrings, spasms- feet, legs - on occasion  . Arthritis     knee, + gout - foot     Past Surgical History  Procedure Laterality Date  . Laparoscopic myotomy    . Tooth implant      lower palate      MEDICATIONS:   No prescriptions prior to admission    ALLERGIES:  No Known Allergies  REVIEW OF SYSTEMS:  Pertinent items noted in HPI and remainder of comprehensive ROS otherwise negative.   FAMILY HISTORY:  No family history on file.  SOCIAL HISTORY:   Social History  Substance Use Topics  . Smoking status: Never Smoker   . Smokeless tobacco: Not on file  . Alcohol Use: Yes     Comment: wine- occasionally      EXAMINATION:  Vital signs in last 24 hours:    General appearance: alert, cooperative and no distress Lungs: clear to auscultation bilaterally Heart: regular rate and rhythm, S1, S2 normal, no murmur, click, rub or gallop Abdomen: soft, non-tender; bowel sounds normal; no masses,  no organomegaly Extremities: extremities normal, atraumatic, no  cyanosis or edema and Homans sign is negative, no sign of DVT Pulses: 2+ and symmetric Skin: Skin color, texture, turgor normal. No rashes or lesions Neurologic: Alert and oriented X 3, normal strength and tone. Normal symmetric reflexes. Normal coordination and gait  Musculoskeletal:  ROM 0-110, Ligaments intact,  Imaging Review Plain radiographs demonstrate severe degenerative joint disease of the left knee. The overall alignment is significant varus. The bone quality appears to be good for age and reported activity level.  Assessment/Plan: Primary osteoarthritis, left knee   The patient history, physical examination and imaging studies are consistent with advanced degenerative joint disease of the left knee. The patient has failed conservative treatment.  The clearance notes were reviewed.  After discussion with the patient it was felt that Total Knee Replacement was indicated. The procedure,  risks, and benefits of total knee arthroplasty were presented and reviewed. The risks including but not limited to aseptic loosening, infection, blood clots, vascular injury, stiffness, patella tracking problems complications among others were discussed. The patient acknowledged the explanation, agreed to proceed with the plan.  Nzinga Ferran 07/06/2015, 6:32 AM

## 2015-07-07 ENCOUNTER — Encounter (HOSPITAL_COMMUNITY): Payer: Self-pay | Admitting: Orthopedic Surgery

## 2015-07-07 LAB — CBC
HEMATOCRIT: 38.4 % — AB (ref 39.0–52.0)
Hemoglobin: 12.6 g/dL — ABNORMAL LOW (ref 13.0–17.0)
MCH: 30 pg (ref 26.0–34.0)
MCHC: 32.8 g/dL (ref 30.0–36.0)
MCV: 91.4 fL (ref 78.0–100.0)
Platelets: 169 10*3/uL (ref 150–400)
RBC: 4.2 MIL/uL — AB (ref 4.22–5.81)
RDW: 12.6 % (ref 11.5–15.5)
WBC: 8.1 10*3/uL (ref 4.0–10.5)

## 2015-07-07 LAB — BASIC METABOLIC PANEL
Anion gap: 8 (ref 5–15)
BUN: 10 mg/dL (ref 6–20)
CALCIUM: 8.5 mg/dL — AB (ref 8.9–10.3)
CO2: 28 mmol/L (ref 22–32)
Chloride: 99 mmol/L — ABNORMAL LOW (ref 101–111)
Creatinine, Ser: 1.07 mg/dL (ref 0.61–1.24)
GFR calc Af Amer: >60 mL/min (ref >60)
GLUCOSE: 115 mg/dL — AB (ref 65–99)
Potassium: 4.1 mmol/L (ref 3.5–5.1)
Sodium: 135 mmol/L (ref 135–145)

## 2015-07-07 MED ORDER — ENOXAPARIN SODIUM 40 MG/0.4ML ~~LOC~~ SOLN: 40.0000 mg | SUBCUTANEOUS | Status: AC

## 2015-07-07 MED ORDER — OXYCODONE HCL ER 10 MG PO T12A: 10.0000 mg | EXTENDED_RELEASE_TABLET | Freq: Two times a day (BID) | ORAL | Status: AC

## 2015-07-07 MED ORDER — METHOCARBAMOL 500 MG PO TABS: 500.0000 mg | ORAL_TABLET | Freq: Four times a day (QID) | ORAL | Status: AC | PRN

## 2015-07-07 MED ORDER — OXYCODONE HCL 5 MG PO TABS: 5.0000 mg | ORAL_TABLET | ORAL | Status: AC | PRN

## 2015-07-07 NOTE — Progress Notes (Signed)
SPORTS MEDICINE AND JOINT REPLACEMENT  Lara Mulch, MD   Carlynn Spry, PA-C [address], [place], [place]  [address]                             [phone]   PROGRESS NOTE  Subjective:  negative for Chest Pain  negative for Shortness of Breath  negative for Nausea/Vomiting   negative for Calf Pain  negative for Bowel Movement   Tolerating Diet: yes         Patient reports pain as 4 on 0-10 scale.    Objective: Vital signs in last 24 hours:   Patient Vitals for the past 24 hrs:  BP Temp Temp src Pulse Resp SpO2 Height Weight  07/07/15 0541 125/84 mmHg 98.9 F (37.2 C) Oral (!) 110 16 100 % - -  07/06/15 2118 122/78 mmHg 98.5 F (36.9 C) Oral 82 16 98 % - -  07/06/15 1818 - - - - - - 6\' 2"  (1.88 m) 92.08 kg (203 lb)  07/06/15 1213 129/85 mmHg 97.5 F (36.4 C) Oral (!) 52 14 100 % - -  07/06/15 1158 122/81 mmHg 97.5 F (36.4 C) - (!) 55 13 98 % - -  07/06/15 1145 126/81 mmHg - - 76 13 97 % - -  07/06/15 1130 115/83 mmHg - - 64 11 98 % - -  07/06/15 1115 112/79 mmHg - - 79 14 96 % - -  07/06/15 1103 115/84 mmHg 97.4 F (36.3 C) - 83 12 98 % - -    @flow {1959:LAST@   Intake/Output from previous day:   10/24 0701 - 10/25 0700 In: 1560 [P.O.:360; I.V.:1200] Out: 1675 [Urine:1600]   Intake/Output this shift:       Intake/Output      10/24 0701 - 10/25 0700 10/25 0701 - 10/26 0700   P.O. 360    I.V. (mL/kg) 1200 (13)    Total Intake(mL/kg) 1560 (16.9)    Urine (mL/kg/hr) 1600 (0.7)    Blood 75 (0)    Total Output 1675     Net -115             LABORATORY DATA:  Recent Labs  07/06/15 0807 07/06/15 1504 07/07/15 0610  WBC  --  9.9 8.1  HGB 14.6 14.0 12.6*  HCT 43.0 42.5 38.4*  PLT  --  191 169    Recent Labs  07/06/15 0807 07/06/15 1504 07/07/15 0610  NA 141  --  135  K 4.2  --  4.1  CL  --   --  99*  CO2  --   --  28  BUN  --   --  10  CREATININE  --  0.91 1.07  GLUCOSE 107*  --  115*  CALCIUM  --   --  8.5*   Lab Results   Component Value Date   INR 1.12 06/26/2015   INR 2.3* 05/18/2007    Examination:  General appearance: alert, cooperative and no distress Extremities: Homans sign is negative, no sign of DVT  Wound Exam: clean, dry, intact   Drainage:  None: wound tissue dry  Motor Exam: EHL and FHL Intact  Sensory Exam: Deep Peroneal normal   Assessment:    1 Day Post-Op  Procedure(s) (LRB): RIGHT TOTAL KNEE ARTHROPLASTY (Right)  ADDITIONAL DIAGNOSIS:  Active Problems:   S/P total knee arthroplasty  Acute Blood Loss Anemia   Plan: Physical Therapy as ordered Weight  Bearing as Tolerated (WBAT)  DVT Prophylaxis:  Lovenox  DISCHARGE PLAN: Home  DISCHARGE NEEDS: HHPT, CPM, Walker and 3-in-1 comode seat         Graclyn Lawther 07/07/2015, 7:30 AM

## 2015-07-07 NOTE — Progress Notes (Signed)
Physical Therapy Treatment Patient Details Name: Richard Rogers MRN: [id number] DOB: 12/31/1945 Today's Date: 07/07/2015    History of Present Illness 69 y.o. male presented with a history of pain in the right knee now s/p Rt TKA. PMH: dysrhythmia (a.fib)    PT Comments    Pt able to ambulate stairs today with SPTA and spouse. Pt displayed signs/communicated of lack of confidence in WB on RLE during stair training. However, when pt grabbed handrail with BUE, he stated he was much more comfortable with ascending and descending steps. Reviewed HEP with pt. with no notable signs of distress.    Follow Up Recommendations  Home health PT;Supervision for mobility/OOB     Equipment Recommendations  None recommended by PT;Other (comment)       Precautions / Restrictions Precautions Precautions: Knee;Fall Precaution Comments: HEP reviewed, reviewed no pillow under knee Restrictions Weight Bearing Restrictions: Yes RLE Weight Bearing: Weight bearing as tolerated    Mobility                    Transfers Overall transfer level: Needs assistance Equipment used: Rolling walker (2 wheeled) Transfers: Sit to/from Stand Sit to Stand: Min guard         General transfer comment: Pt required increased time for transfers due to stiffness.  Ambulation/Gait Ambulation/Gait assistance: Min guard Ambulation Distance (Feet): 200 Feet Assistive device: Rolling walker (2 wheeled) Gait Pattern/deviations: Step-to pattern;Antalgic;Trunk flexed;Decreased weight shift to right;Decreased stride length;Decreased stance time - right Gait velocity: decerased Gait velocity interpretation: <1.8 ft/sec, indicative of risk for recurrent falls (0.39 f/s) General Gait Details: Pt had slight increase in dorsiflexion during gait training   Stairs Stairs: Yes Stairs assistance: +2 safety/equipment (For safety while descending stairs.) Stair Management: One rail Left;One rail Right;Step to  pattern (Rail on L going inside home, rail on R going outside home.) Number of Stairs: 3 General stair comments: Pt required min A with stair training to increase safety and balance. Spouse trained in positioning and hand placement while ascending and decending stairs. Pt educated on going up with the LLE and down with the RLE. Pt very apprehensive to WB on RLE while decending stairs.         Balance Overall balance assessment: Needs assistance         Standing balance support: During functional activity;No upper extremity supported Standing balance-Leahy Scale: Fair Standing balance comment: Pt able to stand at top of the stairs with no UE support for a short period of time.                     Cognition Arousal/Alertness: Awake/alert Behavior During Therapy: WFL for tasks assessed/performed Overall Cognitive Status: Within Functional Limits for tasks assessed                      Exercises Total Joint Exercises Ankle Circles/Pumps: AROM;Both;20 reps;Seated Quad Sets: Strengthening;Right;10 reps;Other (comment);Seated (cues for 5 sec hold) Towel Squeeze: Strengthening;Both;10 reps;Other (comment);Seated (cues for 5 sec hold) Heel Slides: AROM;Right;10 reps;Seated Goniometric ROM: 9-85 degrees of flexion (long sitting in recliner-AROM)        Pertinent Vitals/Pain Pain Assessment: 0-10 Pain Score: 5  Pain Location: Rt knee Pain Descriptors / Indicators: Aching;Sore Pain Intervention(s): Monitored during session;Patient requesting pain meds-RN notified           PT Goals (current goals can now be found in the care plan section) Acute Rehab PT Goals Patient Stated Goal: none stated  PT Goal Formulation: With patient Time For Goal Achievement: 07/20/15 Potential to Achieve Goals: Good Progress towards PT goals: Progressing toward goals    Frequency  7X/week    PT Plan Current plan remains appropriate       End of Session Equipment Utilized  During Treatment: Gait belt Activity Tolerance: Patient tolerated treatment well Patient left: in chair;with call bell/phone within reach;with family/visitor present;Other (comment)     Time: 0174-9449 PT Time Calculation (min) (ACUTE ONLY): 60 min  Charges:    3 Gait  Time, [place] [phone] OFFICE  07/07/2015, 12:00 PM

## 2015-07-07 NOTE — Evaluation (Signed)
Occupational Therapy Evaluation Patient Details Name: Richard Rogers MRN: [id number] DOB: 21-May-1946 Today's Date: 07/07/2015    History of Present Illness 69 y.o. male presented with a history of pain in the right knee now s/p Rt TKA. PMH: dysrhythmia (a.fib)   Clinical Impression   Patient is s/p R TKA surgery resulting in functional limitations due to the deficits listed below (see OT problem list). PTA independent with all adls and iadls. Pt reports minimal pain and very pleased with ability to ambulate already. Patient will benefit from skilled OT acutely to increase independence and safety with ADLS to allow discharge Home without follow up. Pt has all necessary DME. OT to continue to practice and educate on shower transfer to decr fall risk.     Follow Up Recommendations  No OT follow up    Equipment Recommendations  None recommended by OT    Recommendations for Other Services       Precautions / Restrictions Precautions Precautions: Knee;Fall Restrictions RLE Weight Bearing: Weight bearing as tolerated      Mobility Bed Mobility Overal bed mobility: Needs Assistance Bed Mobility: Supine to Sit     Supine to sit: HOB elevated;Min guard     General bed mobility comments: Pt using BIL UE to (A) R LE toward EOB. pt able to progress without physical (A)  Transfers Overall transfer level: Needs assistance Equipment used: Rolling walker (2 wheeled) Transfers: Sit to/from Stand Sit to Stand: Min guard         General transfer comment: Pt needs incr time - first time up this morning. Reports leg feels "stiff"    Balance Overall balance assessment: Needs assistance Sitting-balance support: Feet supported;No upper extremity supported Sitting balance-Leahy Scale: Good     Standing balance support: Single extremity supported;During functional activity Standing balance-Leahy Scale: Fair Standing balance comment: leaning against sink with abdomen to  balance during adl task. Required one uE support                             ADL Overall ADL's : Needs assistance/impaired Eating/Feeding: Independent   Grooming: Oral care;Min guard;Standing   Upper Body Bathing: Modified independent;Sitting   Lower Body Bathing: Min guard;Sit to/from stand Lower Body Bathing Details (indicate cue type and reason): pt able to reach bil ankles so no AE required         Toilet Transfer: Min Psychiatric nurse Details (indicate cue type and reason): pt has 3n1 for home     Tub/ Shower Transfer: Minimal assistance;Shower seat;Ambulation;Rolling walker Tub/Shower Transfer Details (indicate cue type and reason): pt educated with x2 return demo of shower transfer. no family present. Pt  reports "i am a litte fuzzy" Pt return to sitting in recliner with R LE fully extended with zero foam pillow Functional mobility during ADLs: Min guard;Rolling walker General ADL Comments: Pt educated on gradual incr in CPM when d/c home. pt currently able to reach 0/70 this AM. pt educated on adls at sink level and dressing LB with R TKA.      Vision Vision Assessment?: No apparent visual deficits   Perception     Praxis      Pertinent Vitals/Pain       Hand Dominance Right   Extremity/Trunk Assessment Upper Extremity Assessment Upper Extremity Assessment: Overall WFL for tasks assessed   Lower Extremity Assessment Lower Extremity Assessment: Defer to PT evaluation   Cervical / Trunk Assessment Cervical / Trunk  Assessment: Normal   Communication Communication Communication: No difficulties   Cognition Arousal/Alertness: Awake/alert Behavior During Therapy: WFL for tasks assessed/performed Overall Cognitive Status: Within Functional Limits for tasks assessed                     General Comments       Exercises       Shoulder Instructions      Home Living Family/patient expects to be discharged to:: Private  residence Living Arrangements: Spouse/significant other Available Help at Discharge: Family;Available 24 hours/day Type of Home: House Home Access: Stairs to enter [employer] of Steps: 3 Entrance Stairs-Rails: Right Home Layout: One level     Bathroom Shower/Tub: Occupational psychologist: Standard     Home Equipment: Environmental consultant - 2 wheels;Bedside commode;Adaptive equipment;Cane - single point;Shower seat - built in (hurrricane cane) [employer]: Reacher        Prior Functioning/Environment Level of Independence: Independent             OT Diagnosis: Acute pain   OT Problem List: Decreased strength;Decreased activity tolerance;Impaired balance (sitting and/or standing);Decreased safety awareness;Decreased knowledge of use of DME or AE;Decreased knowledge of precautions;Pain   OT Treatment/Interventions: Self-care/ADL training;Therapeutic exercise;DME and/or AE instruction;Therapeutic activities;Patient/family education;Balance training    OT Goals(Current goals can be found in the care plan section) Acute Rehab OT Goals Patient Stated Goal: to return to playing golf OT Goal Formulation: With patient Time For Goal Achievement: 07/21/15 Potential to Achieve Goals: Good  OT Frequency: Min 2X/week   Barriers to D/C:            Co-evaluation              End of Session Equipment Utilized During Treatment: Gait belt;Rolling walker CPM Right Knee CPM Right Knee: On Right Knee Flexion (Degrees): 0 Right Knee Extension (Degrees): 60 Additional Comments: [phone]  Activity Tolerance: Patient tolerated treatment well Patient left: in chair;with call bell/phone within reach   Time: [phone] ([phone]) OT Time Calculation (min): 31 min Charges:  OT General Charges $OT Visit: 1 Procedure OT Evaluation $Initial OT Evaluation Tier I: 1 Procedure OT Treatments $Self Care/Home Management : 23-37 mins G-Codes:    Peri Maris 2015/07/24,  8:56 AM  Pager: [phone]

## 2015-07-07 NOTE — Discharge Instructions (Signed)

## 2015-07-07 NOTE — Op Note (Signed)
TOTAL KNEE REPLACEMENT OPERATIVE NOTE:  07/06/2015  1:08 PM  PATIENT:  Richard Rogers  69 y.o. male  PRE-OPERATIVE DIAGNOSIS:  primary osteoarthritis right knee  POST-OPERATIVE DIAGNOSIS:  primary osteoarthritis right knee  PROCEDURE:  Procedure(s): RIGHT TOTAL KNEE ARTHROPLASTY  SURGEON:  Surgeon(s): Vickey Huger, MD  PHYSICIAN ASSISTANT: Carlynn Spry, [facility]  ANESTHESIA:   general  DRAINS: Hemovac  SPECIMEN: None  COUNTS:  Correct  TOURNIQUET:   Total Tourniquet Time Documented: Thigh (Right) - 55 minutes Total: Thigh (Right) - 55 minutes   DICTATION:  Indication for procedure:    The patient is a 69 y.o. male who has failed conservative treatment for primary osteoarthritis right knee.  Informed consent was obtained prior to anesthesia. The risks versus benefits of the operation were explain and in a way the patient can, and did, understand.   On the implant demand matching protocol, this patient scored 10.  Therefore, this patient did" "did not receive a polyethylene insert with vitamin E which is a high demand implant.  Description of procedure:     The patient was taken to the operating room and placed under anesthesia.  The patient was positioned in the usual fashion taking care that all body parts were adequately padded and/or protected.  I foley catheter was not placed.  A tourniquet was applied and the leg prepped and draped in the usual sterile fashion.  The extremity was exsanguinated with the esmarch and tourniquet inflated to 350 mmHg.  Pre-operative range of motion was normal.  The knee was in 5 degree of mild varus.  A midline incision approximately 6-7 inches long was made with a #10 blade.  A new blade was used to make a parapatellar arthrotomy going 2-3 cm into the quadriceps tendon, over the patella, and alongside the medial aspect of the patellar tendon.  A synovectomy was then performed with the #10 blade and forceps. I then elevated the deep MCL  off the medial tibial metaphysis subperiosteally around to the semimembranosus attachment.    I everted the patella and used calipers to measure patellar thickness.  I used the reamer to ream down to appropriate thickness to recreate the native thickness.  I then removed excess bone with the rongeur and sagittal saw.  I used the appropriately sized template and drilled the three lug holes.  I then put the trial in place and measured the thickness with the calipers to ensure recreation of the native thickness.  The trial was then removed and the patella subluxed and the knee brought into flexion.  A homan retractor was place to retract and protect the patella and lateral structures.  A Z-retractor was place medially to protect the medial structures.  The extra-medullary alignment system was used to make cut the tibial articular surface perpendicular to the anamotic axis of the tibia and in 3 degrees of posterior slope.  The cut surface and alignment jig was removed.  I then used the intramedullary alignment guide to make a 6 valgus cut on the distal femur.  I then marked out the epicondylar axis on the distal femur.  The posterior condylar axis measured 3 degrees.  I then used the anterior referencing sizer and measured the femur to be a size 11.  The 4-In-1 cutting block was screwed into place in external rotation matching the posterior condylar angle, making our cuts perpendicular to the epicondylar axis.  Anterior, posterior and chamfer cuts were made with the sagittal saw.  The cutting block and cut  pieces were removed.  A lamina spreader was placed in 90 degrees of flexion.  The ACL, PCL, menisci, and posterior condylar osteophytes were removed.  A 12 mm spacer blocked was found to offer good flexion and extension gap balance after minimal in degree releasing.   The scoop retractor was then placed and the femoral finishing block was pinned in place.  The small sagittal saw was used as well as the lug  drill to finish the femur.  The block and cut surfaces were removed and the medullary canal hole filled with autograft bone from the cut pieces.  The tibia was delivered forward in deep flexion and external rotation.  A size G tray was selected and pinned into place centered on the medial 1/3 of the tibial tubercle.  The reamer and keel was used to prepare the tibia through the tray.    I then trialed with the size 11 femur, size G tibia, a 12 mm insert and the 38 patella.  I had excellent flexion/extension gap balance, excellent patella tracking.  Flexion was full and beyond 120 degrees; extension was zero.  These components were chosen and the staff opened them to me on the back table while the knee was lavaged copiously and the cement mixed.  The soft tissue was infiltrated with 60cc of exparel 1.3% through a 21 gauge needle.  I cemented in the components and removed all excess cement.  The polyethylene tibial component was snapped into place and the knee placed in extension while cement was hardening.  The capsule was infilltrated with 30cc of .25% Marcaine with epinephrine.  A hemovac was place in the joint exiting superolaterally.  A pain pump was place superomedially superficial to the arthrotomy.  Once the cement was hard, the tourniquet was let down.  Hemostasis was obtained.  The arthrotomy was closed with figure-8 #1 vicryl sutures.  The deep soft tissues were closed with #0 vicryls and the subcuticular layer closed with a running #2-0 vicryl.  The skin was reapproximated and closed with skin staples.  The wound was dressed with xeroform, 4 x4's, 2 ABD sponges, a single layer of webril and a TED stocking.   The patient was then awakened, extubated, and taken to the recovery room in stable condition.  BLOOD LOSS:  300cc DRAINS: 1 hemovac, 1 pain catheter COMPLICATIONS:  None.  PLAN OF CARE: Admit to inpatient   PATIENT DISPOSITION:  PACU - hemodynamically stable.   Delay start of  Pharmacological VTE agent (>24hrs) due to surgical blood loss or risk of bleeding:  not applicable  Please fax a copy of this op note to my office at [phone] (please only include page 1 and 2 of the Case Information op note)

## 2015-07-07 NOTE — Care Management Note (Addendum)
Case Management Note  Patient Details  Name: KOLLIN UDELL MRN: [id number] Date of Birth: 1946-09-10  Subjective/Objective:         S/p right total knee arthroplasty           Action/Plan: Set up with [facility] for HHPT by MD office. Spoke with patient, no change in discharge plan. Patient stated that  Linda has delivered a CPM, rolling walker and 3N1 to his home. Patient stated that his wife will be able to assist him after discharge.   Expected Discharge Date:                  Expected Discharge Plan:  [place]  In-House Referral:  NA  Discharge planning Services  CM Consult  Post Acute Care Choice:  Durable Medical Equipment, Home Health Choice offered to:  Patient  DME Arranged:  3-N-1, CPM, Walker rolling DME Agency:  Other - Comment  HH Arranged:  PT HH Agency:  [place]  Status of Service:  Completed, signed off  Medicare Important Message Given:    Date Medicare IM Given:    Medicare IM give by:    Date Additional Medicare IM Given:    Additional Medicare Important Message give by:     If discussed at [place] of Stay Meetings, dates discussed:    Additional Comments:  Nila Nephew, RN 07/07/2015, 11:15 AM

## 2015-07-24 NOTE — Discharge Summary (Signed)
SPORTS MEDICINE & JOINT REPLACEMENT   Lara Mulch, MD   Carlynn Spry, PA-C [place], [place], [place]  [address]                             [phone]  PATIENT ID: Richard Rogers        MRN:  [id number]          DOB/AGE: 1945/11/12 / 69 y.o.    DISCHARGE SUMMARY  ADMISSION DATE:    07/06/2015 DISCHARGE DATE:   07/07/2015  ADMISSION DIAGNOSIS: primary osteoarthritis right knee    DISCHARGE DIAGNOSIS:  primary osteoarthritis right knee    ADDITIONAL DIAGNOSIS: Active Problems:   S/P total knee arthroplasty  Past Medical History  Diagnosis Date  . Achalasia of esophagus 2004    procedure for correction done at [facility]-    . Dysrhythmia     afib  . H/O cardiovascular stress test 01/2015    done in preparation for knee replacement surg.  Marland Kitchen Shortness of breath dyspnea     when in afib  . Neuromuscular disorder (HCC)     hamstrings, spasms- feet, legs - on occasion  . Arthritis     knee, + gout - foot      PROCEDURE: Procedure(s): RIGHT TOTAL KNEE ARTHROPLASTY on 07/06/2015  CONSULTS:     HISTORY:  See H&P in chart  HOSPITAL COURSE:  MESHILEM SHURN is a 69 y.o. admitted on 07/06/2015 and found to have a diagnosis of primary osteoarthritis right knee.  After appropriate laboratory studies were obtained  they were taken to the operating room on 07/06/2015 and underwent Procedure(s): RIGHT TOTAL KNEE ARTHROPLASTY.   They were given perioperative antibiotics:  Anti-infectives    Start     Dose/Rate Route Frequency Ordered Stop   07/06/15 1500  ceFAZolin (ANCEF) IVPB 2 g/50 mL premix     2 g 100 mL/hr over 30 Minutes Intravenous Every 6 hours 07/06/15 1225 07/06/15 2256   07/06/15 0600  ceFAZolin (ANCEF) IVPB 2 g/50 mL premix     2 g 100 mL/hr over 30 Minutes Intravenous On call to O.R. 07/05/15 1443 07/06/15 0900    .  Tolerated the procedure well.  Placed with a foley intraoperatively.  Given Ofirmev at induction and for 48 hours.    POD# 1:  Vital signs were stable.  Patient denied Chest pain, shortness of breath, or calf pain.  Patient was started on Lovenox 30 mg subcutaneously twice daily at 8am.  Consults to PT, OT, and care management were made.  The patient was weight bearing as tolerated.  CPM was placed on the operative leg 0-90 degrees for 6-8 hours a day.  Incentive spirometry was taught.  Dressing was changed.  Hemovac was discontinued.      POD #2, Continued  PT for ambulation and exercise program.  IV saline locked.  O2 discontinued.    The remainder of the hospital course was dedicated to ambulation and strengthening.   The patient was discharged on post op day 1 in  Good condition.  Blood products given:none  DIAGNOSTIC STUDIES: Recent vital signs: No data found.      Recent laboratory studies: No results for input(s): WBC, HGB, HCT, PLT in the last 168 hours. No results for input(s): NA, K, CL, CO2, BUN, CREATININE, GLUCOSE, CALCIUM in the last 168 hours. Lab Results  Component Value Date   INR 1.12 06/26/2015   INR  2.3* 05/18/2007     Recent Radiographic Studies :  Dg Chest 2 View  06/26/2015  CLINICAL DATA:  69 year old male -pre operative respiratory examination for right knee surgery. EXAM: CHEST  2 VIEW COMPARISON:  05/18/2007 chest radiograph FINDINGS: The cardiomediastinal silhouette is unremarkable. There is no evidence of focal airspace disease, pulmonary edema, suspicious pulmonary nodule/mass, pleural effusion, or pneumothorax. No acute bony abnormalities are identified. IMPRESSION: No active cardiopulmonary disease. Electronically Signed   By: Margarette Canada M.D.   On: 06/26/2015 14:25    DISCHARGE INSTRUCTIONS: Discharge Instructions    CPM    Complete by:  As directed   Continuous passive motion machine (CPM):      Use the CPM from 0 to 90 for 6-8 hours per day.      You may increase by 10 per day.  You may break it up into 2 or 3 sessions per day.      Use CPM for 2 weeks or until you are  told to stop.     Call MD / Call 911    Complete by:  As directed   If you experience chest pain or shortness of breath, CALL 911 and be transported to the hospital emergency room.  If you develope a fever above 101 F, pus (white drainage) or increased drainage or redness at the wound, or calf pain, call your surgeon's office.     Change dressing    Complete by:  As directed   Change dressing on Wednesday, then change the dressing daily with sterile 4 x 4 inch gauze dressing and apply TED hose or ace wrap.     Constipation Prevention    Complete by:  As directed   Drink plenty of fluids.  Prune juice may be helpful.  You may use a stool softener, such as Colace (over the counter) 100 mg twice a day.  Use MiraLax (over the counter) for constipation as needed.     Diet - low sodium heart healthy    Complete by:  As directed      Do not put a pillow under the knee. Place it under the heel.    Complete by:  As directed      Driving restrictions    Complete by:  As directed   No driving for 6 weeks     Increase activity slowly as tolerated    Complete by:  As directed      Lifting restrictions    Complete by:  As directed   No lifting for 6 weeks     TED hose    Complete by:  As directed   Use stockings (TED hose) for 2 weeks on both leg(s).  You may remove them at night for sleeping.           DISCHARGE MEDICATIONS:     Medication List    STOP taking these medications        aspirin EC 81 MG tablet      TAKE these medications        CHERRY PO  Take 1 tablet by mouth daily.     cyanocobalamin 500 MCG tablet  Take 500 mcg by mouth daily.     enoxaparin 40 MG/0.4ML injection  Commonly known as:  LOVENOX  Inject 0.4 mLs (40 mg total) into the skin daily.     FLAX SEED OIL PO  Take 1 tablet by mouth daily.     GINGER PO  Take 1 tablet  by mouth daily.     MAGNESIUM CITRATE PO  Take 1 each by mouth daily.     methocarbamol 500 MG tablet  Commonly known as:  ROBAXIN   Take 1-2 tablets (500-1,000 mg total) by mouth every 6 (six) hours as needed for muscle spasms.     metoprolol succinate 100 MG 24 hr tablet  Commonly known as:  TOPROL-XL  Take 75 mg by mouth daily. Take with or immediately following a meal.     oxyCODONE 5 MG immediate release tablet  Commonly known as:  Oxy IR/ROXICODONE  Take 1-2 tablets (5-10 mg total) by mouth every 3 (three) hours as needed for breakthrough pain.     oxyCODONE 10 mg 12 hr tablet  Commonly known as:  oxyCODONE  Take 1 tablet (10 mg total) by mouth every 12 (twelve) hours.     TURMERIC PO  Take 1 tablet by mouth daily.     Vitamin D 2000 UNITS Caps  Take 2,000 Units by mouth daily.        FOLLOW UP VISIT:       Follow-up Information    Follow up with [facility].   Why:  They will contact you to schedule home therapy visits.   Contact information:   [address] [place] [address] [phone]       Follow up with Rudean Haskell, MD. Call on 07/21/2015.   Specialty:  Orthopedic Surgery   Contact information:   [place] [address] [phone]       DISPOSITION: HOME   CONDITION:  Good   Zeki Bedrosian 07/24/2015, 5:26 PM

## 2017-01-06 ENCOUNTER — Encounter: Payer: Self-pay | Admitting: *Deleted

## 2017-01-09 ENCOUNTER — Ambulatory Visit
Admission: RE | Admit: 2017-01-09 | Discharge: 2017-01-09 | Disposition: A | Discharge status: Home / Self Care | Payer: Medicare HMO | Source: Ambulatory Visit | Attending: Unknown Physician Specialty | Admitting: Unknown Physician Specialty

## 2017-01-09 ENCOUNTER — Ambulatory Visit: Payer: Medicare HMO | Admitting: Anesthesiology

## 2017-01-09 ENCOUNTER — Encounter: Payer: Self-pay | Admitting: *Deleted

## 2017-01-09 ENCOUNTER — Encounter
Admission: RE | Disposition: A | Discharge status: Home / Self Care | Payer: Self-pay | Source: Ambulatory Visit | Attending: Unknown Physician Specialty

## 2017-01-09 DIAGNOSIS — M109 Gout, unspecified: Secondary | ICD-10-CM | POA: Insufficient documentation

## 2017-01-09 DIAGNOSIS — K220 Achalasia of cardia: Secondary | ICD-10-CM | POA: Diagnosis not present

## 2017-01-09 DIAGNOSIS — K219 Gastro-esophageal reflux disease without esophagitis: Secondary | ICD-10-CM | POA: Diagnosis not present

## 2017-01-09 DIAGNOSIS — K640 First degree hemorrhoids: Secondary | ICD-10-CM | POA: Diagnosis not present

## 2017-01-09 DIAGNOSIS — K5730 Diverticulosis of large intestine without perforation or abscess without bleeding: Secondary | ICD-10-CM | POA: Insufficient documentation

## 2017-01-09 DIAGNOSIS — D123 Benign neoplasm of transverse colon: Secondary | ICD-10-CM | POA: Insufficient documentation

## 2017-01-09 DIAGNOSIS — Z79899 Other long term (current) drug therapy: Secondary | ICD-10-CM | POA: Diagnosis not present

## 2017-01-09 DIAGNOSIS — G709 Myoneural disorder, unspecified: Secondary | ICD-10-CM | POA: Insufficient documentation

## 2017-01-09 DIAGNOSIS — Z96651 Presence of right artificial knee joint: Secondary | ICD-10-CM | POA: Diagnosis not present

## 2017-01-09 DIAGNOSIS — I4891 Unspecified atrial fibrillation: Secondary | ICD-10-CM | POA: Insufficient documentation

## 2017-01-09 DIAGNOSIS — Z1211 Encounter for screening for malignant neoplasm of colon: Secondary | ICD-10-CM | POA: Insufficient documentation

## 2017-01-09 DIAGNOSIS — M1990 Unspecified osteoarthritis, unspecified site: Secondary | ICD-10-CM | POA: Diagnosis not present

## 2017-01-09 HISTORY — PX: COLONOSCOPY WITH PROPOFOL: SHX5780

## 2017-01-09 SURGERY — COLONOSCOPY WITH PROPOFOL
Anesthesia: General

## 2017-01-09 MED ORDER — LIDOCAINE HCL (PF) 2 % IJ SOLN
INTRAMUSCULAR | Status: DC | PRN
  Administered 2017-01-09: 50 mg

## 2017-01-09 MED ORDER — SODIUM CHLORIDE 0.9 % IV SOLN
INTRAVENOUS | Status: DC
Start: 2017-01-09 — End: 2017-01-09
  Administered 2017-01-09: 11:00:00 via INTRAVENOUS

## 2017-01-09 MED ORDER — PROPOFOL 500 MG/50ML IV EMUL
INTRAVENOUS | Status: DC | PRN
  Administered 2017-01-09: 50 ug/kg/min via INTRAVENOUS

## 2017-01-09 MED ORDER — MIDAZOLAM HCL 2 MG/2ML IJ SOLN
INTRAMUSCULAR | Status: AC
  Filled 2017-01-09: qty 2

## 2017-01-09 MED ORDER — FENTANYL CITRATE (PF) 100 MCG/2ML IJ SOLN
INTRAMUSCULAR | Status: AC
  Filled 2017-01-09: qty 2

## 2017-01-09 MED ORDER — MIDAZOLAM HCL 5 MG/5ML IJ SOLN
INTRAMUSCULAR | Status: DC | PRN
  Administered 2017-01-09 (×2): 1 mg via INTRAVENOUS

## 2017-01-09 MED ORDER — SODIUM CHLORIDE 0.9 % IV SOLN: INTRAVENOUS | Status: DC

## 2017-01-09 MED ORDER — PROPOFOL 10 MG/ML IV BOLUS
INTRAVENOUS | Status: DC | PRN
  Administered 2017-01-09: 10 mg via INTRAVENOUS
  Administered 2017-01-09: 30 mg via INTRAVENOUS

## 2017-01-09 MED ORDER — PROPOFOL 10 MG/ML IV BOLUS
INTRAVENOUS | Status: AC
  Filled 2017-01-09: qty 20

## 2017-01-09 MED ORDER — FENTANYL CITRATE (PF) 100 MCG/2ML IJ SOLN
INTRAMUSCULAR | Status: DC | PRN
  Administered 2017-01-09 (×2): 25 ug via INTRAVENOUS
  Administered 2017-01-09: 50 ug via INTRAVENOUS

## 2017-01-09 MED ORDER — PIPERACILLIN-TAZOBACTAM 3.375 G IVPB 30 MIN
3.3750 g | Freq: Once | INTRAVENOUS | Status: AC
  Administered 2017-01-09: 3.375 g via INTRAVENOUS
  Filled 2017-01-09: qty 50

## 2017-01-09 NOTE — Anesthesia Post-op Follow-up Note (Cosign Needed)
Anesthesia QCDR form completed.        

## 2017-01-09 NOTE — Op Note (Signed)
[facility] Patient Name: Richard Rogers Procedure Date: 01/09/2017 11:18 AM MRN: [id number] Account #: [id number] Date of Birth: 1946/07/20 Admit Type: Outpatient Age: 71 Room: [facility] ENDO ROOM 1 Gender: Male Note Status: Finalized Procedure:            Colonoscopy Indications:          Screening for colorectal malignant neoplasm Providers:            Manya Silvas, MD Referring MD:         Leonie Draydon. Doy Hutching, MD (Referring MD) Medicines:            Propofol per Anesthesia Complications:        No immediate complications. Procedure:            Pre-Anesthesia Assessment:                       - After reviewing the risks and benefits, the patient                        was deemed in satisfactory condition to undergo the                        procedure.                       After obtaining informed consent, the colonoscope was                        passed under direct vision. Throughout the procedure,                        the patient's blood pressure, pulse, and oxygen                        saturations were monitored continuously. The                        Colonoscope was introduced through the anus and                        advanced to the the cecum, identified by appendiceal                        orifice and ileocecal valve. The colonoscopy was                        performed without difficulty. The patient tolerated the                        procedure well. The quality of the bowel preparation                        was excellent. Findings:      A small polyp was found in the hepatic flexure. The polyp was sessile.       The polyp was removed with a hot snare. Resection and retrieval were       complete. To prevent bleeding after the polypectomy, one hemostatic clip       was successfully placed. There was no bleeding during, or at the end, of       the procedure.  A diminutive polyp was found in the hepatic flexure. The polyp  was       sessile. The polyp was removed with a cold snare. Resection and       retrieval were complete.      Multiple small and large-mouthed diverticula were found in the sigmoid       colon and descending colon.      Internal hemorrhoids were found during endoscopy. The hemorrhoids were       small and Grade I (internal hemorrhoids that do not prolapse).      The exam was otherwise without abnormality. Impression:           - One small polyp at the hepatic flexure, removed with                        a hot snare. Resected and retrieved. Clip was placed.                       - One diminutive polyp at the hepatic flexure, removed                        with a cold snare. Resected and retrieved.                       - Diverticulosis in the sigmoid colon and in the                        descending colon.                       - Internal hemorrhoids.                       - The examination was otherwise normal. Recommendation:       - Await pathology results. Manya Silvas, MD 01/09/2017 11:48:58 AM This report has been signed electronically. Number of Addenda: 0 Note Initiated On: 01/09/2017 11:18 AM Scope Withdrawal Time: 0 hours 6 minutes 18 seconds  Total Procedure Duration: 0 hours 17 minutes 48 seconds       [facility]

## 2017-01-09 NOTE — Anesthesia Postprocedure Evaluation (Signed)
Anesthesia Post Note  Patient: Richard Rogers  Procedure(s) Performed: Procedure(s) (LRB): COLONOSCOPY WITH PROPOFOL (N/A)  Patient location during evaluation: Endoscopy Anesthesia Type: General Level of consciousness: awake and alert Pain management: pain level controlled Vital Signs Assessment: post-procedure vital signs reviewed and stable Respiratory status: spontaneous breathing, nonlabored ventilation, respiratory function stable and patient connected to nasal cannula oxygen Cardiovascular status: blood pressure returned to baseline and stable Postop Assessment: no signs of nausea or vomiting Anesthetic complications: no     Last Vitals:  Vitals:   01/09/17 1209 01/09/17 1219  BP: 108/85 113/89  Pulse: 75 75  Resp: 16 15  Temp:      Last Pain:  Vitals:   01/09/17 1149  TempSrc: Tympanic                 Martha Clan

## 2017-01-09 NOTE — H&P (Signed)
Primary Care Physician:  Idelle Crouch, MD Primary Gastroenterologist:  Dr. Vira Agar  Pre-Procedure History & Physical: HPI:  Richard Rogers is a 71 y.o. male is here for an colonoscopy.   Past Medical History:  Diagnosis Date  . Achalasia of esophagus 2004   procedure for correction done at [facility]-    . Arthritis    knee, + gout - foot    . Dysrhythmia    afib  . H/O cardiovascular stress test 01/2015   done in preparation for knee replacement surg.  Marland Kitchen Neuromuscular disorder (HCC)    hamstrings, spasms- feet, legs - on occasion  . Shortness of breath dyspnea    when in afib    Past Surgical History:  Procedure Laterality Date  . LAPAROSCOPIC MYOTOMY    . tooth implant     lower palate   . TOTAL KNEE ARTHROPLASTY Right 07/06/2015   Procedure: RIGHT TOTAL KNEE ARTHROPLASTY;  Surgeon: Vickey Huger, MD;  Location: [place];  Service: Orthopedics;  Laterality: Right;    Prior to Admission medications   Medication Sig Start Date End Date Taking? Authorizing Provider  CHERRY PO Take 1 tablet by mouth daily.   Yes Historical Provider, MD  Cholecalciferol (VITAMIN D) 2000 UNITS CAPS Take 2,000 Units by mouth daily.   Yes Historical Provider, MD  cyanocobalamin 500 MCG tablet Take 500 mcg by mouth daily.   Yes Historical Provider, MD  Flaxseed, Linseed, (FLAX SEED OIL PO) Take 1 tablet by mouth daily.   Yes Historical Provider, MD  Ginger, Zingiber officinalis, (GINGER PO) Take 1 tablet by mouth daily.   Yes Historical Provider, MD  methocarbamol (ROBAXIN) 500 MG tablet Take 1-2 tablets (500-1,000 mg total) by mouth every 6 (six) hours as needed for muscle spasms. 07/07/15  Yes Carlynn Spry, PA-C  metoprolol succinate (TOPROL-XL) 100 MG 24 hr tablet Take 75 mg by mouth daily. Take with or immediately following a meal.   Yes Historical Provider, MD  TURMERIC PO Take 1 tablet by mouth daily.   Yes Historical Provider, MD  enoxaparin (LOVENOX) 40 MG/0.4ML injection Inject 0.4 mLs (40  mg total) into the skin daily. Patient not taking: Reported on 01/09/2017 07/07/15   Carlynn Spry, PA-C  MAGNESIUM CITRATE PO Take 1 each by mouth daily.    Historical Provider, MD  oxyCODONE (OXY IR/ROXICODONE) 5 MG immediate release tablet Take 1-2 tablets (5-10 mg total) by mouth every 3 (three) hours as needed for breakthrough pain. Patient not taking: Reported on 01/09/2017 07/07/15   Carlynn Spry, PA-C  OxyCODONE (OXYCONTIN) 10 mg T12A 12 hr tablet Take 1 tablet (10 mg total) by mouth every 12 (twelve) hours. Patient not taking: Reported on 01/09/2017 07/07/15   Carlynn Spry, PA-C    Allergies as of 11/04/2016  . (No Known Allergies)    History reviewed. No pertinent family history.  Social History   Social History  . Marital status: Married    Spouse name: N/A  . Number of children: N/A  . Years of education: N/A   Occupational History  . Not on file.   Social History Main Topics  . Smoking status: Never Smoker  . Smokeless tobacco: Never Used  . Alcohol use Yes     Comment: wine- occasionally   . Drug use: No  . Sexual activity: Not on file   Other Topics Concern  . Not on file   Social History Narrative  . No narrative on file    Review of Systems:  See HPI, otherwise negative ROS  Physical Exam: BP 131/76   Pulse 74   Temp (!) 96.9 F (36.1 C) (Tympanic)   Resp 16   Ht 6\' 2"  (1.88 m)   Wt 86.9 kg (191 lb 8 oz)   SpO2 97%   BMI 24.59 kg/m  General:   Alert,  pleasant and cooperative in NAD Head:  Normocephalic and atraumatic. Neck:  Supple; no masses or thyromegaly. Lungs:  Clear throughout to auscultation.    Heart:  Regular rate and rhythm. Abdomen:  Soft, nontender and nondistended. Normal bowel sounds, without guarding, and without rebound.   Neurologic:  Alert and  oriented x4;  grossly normal neurologically.  Impression/Plan: Richard Rogers is here for an colonoscopy to be performed for screening colonoscopy  Risks, benefits,  limitations, and alternatives regarding  colonoscopy have been reviewed with the patient.  Questions have been answered.  All parties agreeable.   Gaylyn Cheers, MD  01/09/2017, 11:18 AM

## 2017-01-09 NOTE — Transfer of Care (Signed)
Immediate Anesthesia Transfer of Care Note  Patient: Richard Rogers  Procedure(s) Performed: Procedure(s): COLONOSCOPY WITH PROPOFOL (N/A)  Patient Location: PACU  Anesthesia Type:General  Level of Consciousness: sedated  Airway & Oxygen Therapy: Patient Spontanous Breathing and Patient connected to nasal cannula oxygen  Post-op Assessment: Report given to RN and Post -op Vital signs reviewed and stable  Post vital signs: Reviewed and stable  Last Vitals:  Vitals:   01/09/17 1028  BP: 131/76  Pulse: 74  Resp: 16  Temp: (!) 36.1 C    Last Pain:  Vitals:   01/09/17 1028  TempSrc: Tympanic         Complications: No apparent anesthesia complications

## 2017-01-09 NOTE — Anesthesia Preprocedure Evaluation (Addendum)
Anesthesia Evaluation  Patient identified by MRN, date of birth, ID band Patient awake    Reviewed: Allergy & Precautions, H&P , NPO status , Patient's Chart, lab work & pertinent test results, reviewed documented beta blocker date and time   History of Anesthesia Complications Negative for: history of anesthetic complications  Airway Mallampati: I  TM Distance: >3 FB Neck ROM: full    Dental  (+) Dental Advidsory Given, Implants, Caps, Missing Bridge, permanent:   Pulmonary shortness of breath (with a fib), neg sleep apnea, neg COPD, neg recent URI,           Cardiovascular Exercise Tolerance: Good (-) hypertension(-) CAD, (-) Past MI, (-) Cardiac Stents and (-) CABG + dysrhythmias Atrial Fibrillation (-) Valvular Problems/Murmurs     Neuro/Psych negative neurological ROS  negative psych ROS   GI/Hepatic Neg liver ROS, GERD  ,achalasia   Endo/Other  negative endocrine ROS  Renal/GU negative Renal ROS  negative genitourinary   Musculoskeletal   Abdominal   Peds  Hematology negative hematology ROS (+)   Anesthesia Other Findings Past Medical History: 2004: Achalasia of esophagus     Comment: procedure for correction done at [facility]-   No date: Arthritis     Comment: knee, + gout - foot   No date: Dysrhythmia     Comment: afib 01/2015: H/O cardiovascular stress test     Comment: done in preparation for knee replacement surg. No date: Neuromuscular disorder (HCC)     Comment: hamstrings, spasms- feet, legs - on occasion No date: Shortness of breath dyspnea     Comment: when in afib   Reproductive/Obstetrics negative OB ROS                            Anesthesia Physical Anesthesia Plan  ASA: II  Anesthesia Plan: General   Post-op Pain Management:    Induction:   Airway Management Planned:   Additional Equipment:   Intra-op Plan:   Post-operative Plan:   Informed Consent: I  have reviewed the patients History and Physical, chart, labs and discussed the procedure including the risks, benefits and alternatives for the proposed anesthesia with the patient or authorized representative who has indicated his/her understanding and acceptance.   Dental Advisory Given  Plan Discussed with: Anesthesiologist, CRNA and Surgeon  Anesthesia Plan Comments:         Anesthesia Quick Evaluation

## 2017-01-10 ENCOUNTER — Encounter: Payer: Self-pay | Admitting: Unknown Physician Specialty

## 2017-01-10 LAB — SURGICAL PATHOLOGY

## 2017-10-30 ENCOUNTER — Emergency Department (HOSPITAL_COMMUNITY)
Admission: EM | Admit: 2017-10-30 | Discharge: 2017-10-30 | Disposition: A | Discharge status: Home / Self Care | Payer: Medicare HMO | Attending: Emergency Medicine | Admitting: Emergency Medicine

## 2017-10-30 DIAGNOSIS — R55 Syncope and collapse: Secondary | ICD-10-CM | POA: Diagnosis not present

## 2017-10-30 DIAGNOSIS — K220 Achalasia of cardia: Secondary | ICD-10-CM | POA: Diagnosis not present

## 2017-10-30 DIAGNOSIS — I4891 Unspecified atrial fibrillation: Secondary | ICD-10-CM | POA: Insufficient documentation

## 2017-10-30 DIAGNOSIS — E860 Dehydration: Secondary | ICD-10-CM | POA: Diagnosis not present

## 2017-10-30 DIAGNOSIS — Z79899 Other long term (current) drug therapy: Secondary | ICD-10-CM | POA: Insufficient documentation

## 2017-10-30 DIAGNOSIS — Z7901 Long term (current) use of anticoagulants: Secondary | ICD-10-CM | POA: Diagnosis not present

## 2017-10-30 LAB — CBC
HCT: 45.8 % (ref 39.0–52.0)
HEMOGLOBIN: 14.9 g/dL (ref 13.0–17.0)
MCH: 30.1 pg (ref 26.0–34.0)
MCHC: 32.5 g/dL (ref 30.0–36.0)
MCV: 92.5 fL (ref 78.0–100.0)
Platelets: 190 10*3/uL (ref 150–400)
RBC: 4.95 MIL/uL (ref 4.22–5.81)
RDW: 12.7 % (ref 11.5–15.5)
WBC: 9.9 10*3/uL (ref 4.0–10.5)

## 2017-10-30 LAB — BASIC METABOLIC PANEL
ANION GAP: 12 (ref 5–15)
BUN: 17 mg/dL (ref 6–20)
CALCIUM: 8.7 mg/dL — AB (ref 8.9–10.3)
CO2: 22 mmol/L (ref 22–32)
Chloride: 110 mmol/L (ref 101–111)
Creatinine, Ser: 1.14 mg/dL (ref 0.61–1.24)
Glucose, Bld: 75 mg/dL (ref 65–99)
Potassium: 3.6 mmol/L (ref 3.5–5.1)
SODIUM: 144 mmol/L (ref 135–145)

## 2017-10-30 MED ORDER — SODIUM CHLORIDE 0.9 % IV BOLUS (SEPSIS)
500.0000 mL | Freq: Once | INTRAVENOUS | Status: AC
  Administered 2017-10-30: 500 mL via INTRAVENOUS

## 2017-10-30 NOTE — ED Notes (Signed)
Pt and family understood dc material. NAD noted 

## 2017-10-30 NOTE — ED Provider Notes (Signed)
[facility] EMERGENCY DEPARTMENT Provider Note   CSN: [id number] Arrival date & time: 10/30/17  0242     History   Chief Complaint Chief Complaint  Patient presents with  . Near Syncope    HPI Richard Rogers is a 72 y.o. male.  Patient presents by EMS for near syncopal episode tonight when he got up to go to the bathroom. He felt lightheaded and sat down before a fall. No pain. 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 His PO intake today was minimal. He denies esophageal obstruction as he did eat a piece of toast and drink some water that passed without pain or regurgitation. No recent fever, diarrhea, vomiting illness, cough, congestion. He has a history of chronic atrial fibrillation, on metoprolol and aspirin.   The history is provided by the patient and the spouse. No language interpreter was used.    Past Medical History:  Diagnosis Date  . Achalasia of esophagus 2004   procedure for correction done at [facility]-    . Arthritis    knee, + gout - foot    . Dysrhythmia    afib  . H/O cardiovascular stress test 01/2015   done in preparation for knee replacement surg.  Marland Kitchen Neuromuscular disorder (HCC)    hamstrings, spasms- feet, legs - on occasion  . Shortness of breath dyspnea    when in afib    Patient Active Problem List   Diagnosis Date Noted  . 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 COLONOSCOPY WITH PROPOFOL N/A 01/09/2017   Procedure: COLONOSCOPY WITH PROPOFOL;  Surgeon: Manya Silvas, MD;  Location: [facility] ENDOSCOPY;  Service: Endoscopy;  Laterality: N/A;  . LAPAROSCOPIC MYOTOMY    . tooth implant     lower palate   . TOTAL KNEE ARTHROPLASTY Right 07/06/2015   Procedure: RIGHT TOTAL KNEE ARTHROPLASTY;  Surgeon: Vickey Huger, MD;  Location: [facility];  Service: Orthopedics;  Laterality: Right;       Home  Medications    Prior to Admission medications   Medication Sig Start Date End Date Taking? Authorizing Provider  CHERRY PO Take 1 tablet by mouth daily.    [provider]  Cholecalciferol (VITAMIN D) 2000 UNITS CAPS Take 2,000 Units by mouth daily.    [provider]  cyanocobalamin 500 MCG tablet Take 500 mcg by mouth daily.    [provider]  enoxaparin (LOVENOX) 40 MG/0.4ML injection Inject 0.4 mLs (40 mg total) into the skin daily. Patient not taking: Reported on 01/09/2017 07/07/15   Carlynn Spry, PA-C  Flaxseed, Linseed, (FLAX SEED OIL PO) Take 1 tablet by mouth daily.    [provider]  Ginger, Zingiber officinalis, (GINGER PO) Take 1 tablet by mouth daily.    [provider]  MAGNESIUM CITRATE PO Take 1 each by mouth daily.    [provider]  methocarbamol (ROBAXIN) 500 MG tablet Take 1-2 tablets (500-1,000 mg total) by mouth every 6 (six) hours as needed for muscle spasms. 07/07/15   Carlynn Spry, PA-C  metoprolol succinate (TOPROL-XL) 100 MG 24 hr tablet Take 75 mg by mouth daily. Take with or immediately following a meal.    [provider]  oxyCODONE (OXY IR/ROXICODONE) 5 MG immediate release tablet Take 1-2 tablets (5-10 mg total) by mouth every 3 (three) hours as needed for breakthrough pain. Patient not  taking: Reported on 01/09/2017 07/07/15   Carlynn Spry, PA-C  OxyCODONE (OXYCONTIN) 10 mg T12A 12 hr tablet Take 1 tablet (10 mg total) by mouth every 12 (twelve) hours. Patient not taking: Reported on 01/09/2017 07/07/15   Carlynn Spry, PA-C  TURMERIC PO Take 1 tablet by mouth daily.    [provider]    Family History No family history on file.  Social History Social History   Tobacco Use  . Smoking status: Never Smoker  . Smokeless tobacco: Never Used  Substance Use Topics  . Alcohol use: Yes    Comment: wine- occasionally   . Drug use: No     Allergies   Patient has no known  allergies.   Review of Systems Review of Systems  Constitutional: Negative for chills and fever.  HENT: Negative.   Respiratory: Negative.  Negative for shortness of breath.   Cardiovascular: Negative.  Negative for chest pain.  Gastrointestinal: Negative.  Negative for abdominal pain, diarrhea and vomiting.  Musculoskeletal: Negative.   Skin: Negative.   Neurological: Positive for weakness and light-headedness.     Physical Exam Updated Vital Signs BP 118/77 (BP Location: Right Arm)   Pulse 96   Temp 98.1 F (36.7 C) (Oral)   Resp 18   SpO2 97%   Physical Exam  Constitutional: He is oriented to person, place, and time. He appears well-developed and well-nourished.  HENT:  Head: Normocephalic.  Neck: Normal range of motion. Neck supple.  Cardiovascular: Normal rate. An irregularly irregular rhythm present.  No murmur heard. Pulmonary/Chest: Effort normal and breath sounds normal. He has no wheezes. He has no rales. He exhibits no tenderness.  Abdominal: Soft. Bowel sounds are normal. There is no tenderness. There is no rebound and no guarding.  Musculoskeletal: Normal range of motion. He exhibits no edema.  Neurological: He is alert and oriented to person, place, and time.  Skin: Skin is warm and dry. No rash noted.  Psychiatric: He has a normal mood and affect.     ED Treatments / Results  Labs (all labs ordered are listed, but only abnormal results are displayed) Labs Reviewed  BASIC METABOLIC PANEL  CBC  URINALYSIS, ROUTINE W REFLEX MICROSCOPIC  CBG MONITORING, ED    EKG  EKG Interpretation None       Radiology No results found.  Procedures Procedures (including critical care time)  Medications Ordered in ED Medications - No data to display   Initial Impression / Assessment and Plan / ED Course  I have reviewed the triage vital signs and the nursing notes.  Pertinent labs & imaging results that were available during my care of the patient were  reviewed by me and considered in my medical decision making (see chart for details).     Patient with a history of chronic a-fib got up to the bathroom tonight and experienced a near syncopal episode. No fall. He recovered after sitting for some time. No pain.   He reports easy dehydration secondary to achalasia, but denies food impaction. He has had similar episodes in the past when he has had decreased PO intake and reports easy dehydration.  He is given IVF's and PO fluids here and reports he feels much better. VSS. He ambulates without symptoms of lightheadedness. Discussed with Dr. Betsey Holiday. He is felt appropriate for discharge home.   Final Clinical Impressions(s) / ED Diagnoses   Final diagnoses:  None   1. Near syncope 2. dehydration  ED Discharge Orders  None       Charlann Lange, PA-C 10/30/17 0544    Orpah Greek, MD 10/30/17 [phone]

## 2017-10-30 NOTE — ED Triage Notes (Signed)
Pt BIB [facility] for near syncope. Pt was getting up when he got sweaty and felt like he was going to pass out. Pt lowered himself to a chair. EMS arrived and stated that he was gray in color. Pt is A+O currently. Afib on monitor normal for him.

## 2017-10-30 NOTE — ED Provider Notes (Signed)
Patient presented to the ER with near syncope.  Patient reports that he has not been able to eat or drink today because of his chronic achalasia.  He had an episode of dizziness and feeling like he was going to pass out earlier tonight.  He did not lose consciousness.  He is back to his normal baseline.  Face to face Exam: HEENT - PERRLA Lungs - CTAB Heart - RRR, no M/R/G Abd - S/NT/ND Neuro - alert, oriented x3  Plan: We will gently hydrate.  Basic labs unremarkable.  Patient is in A. fib.  This is chronic for him.  He is not on anticoagulation because his cardiologist has assigned him a Chads-vasc score of 1 and he has a distant history of GI bleeding.  He has not had any focal symptoms that would suggest this was a TIA.   Orpah Greek, MD 10/30/17 [phone]

## 2017-10-30 NOTE — ED Notes (Signed)
Ambulated Pt in [place]. Pt did well. No assistance needed. BP Taken before and after his walk. Pt stated he did not feel dizzy at all while walking and felt like his normal self.

## 2018-08-16 ENCOUNTER — Other Ambulatory Visit: Payer: Self-pay | Admitting: Internal Medicine

## 2018-08-16 DIAGNOSIS — N281 Cyst of kidney, acquired: Secondary | ICD-10-CM

## 2018-08-22 ENCOUNTER — Ambulatory Visit
Admission: RE | Admit: 2018-08-22 | Discharge: 2018-08-22 | Disposition: A | Discharge status: Home / Self Care | Payer: Medicare HMO | Source: Ambulatory Visit | Attending: Internal Medicine | Admitting: Internal Medicine

## 2018-08-22 DIAGNOSIS — N281 Cyst of kidney, acquired: Secondary | ICD-10-CM | POA: Diagnosis not present

## 2019-04-19 IMAGING — US US RENAL
1 series · 13 of 25 positions shown · non-contrast
Comparison: None.

CLINICAL DATA: 72-year-old male with bilateral renal cysts.

EXAM:
RENAL / URINARY TRACT ULTRASOUND COMPLETE

[Series 1: us renal · 13 of 49 slices shown]
[im 1/49]
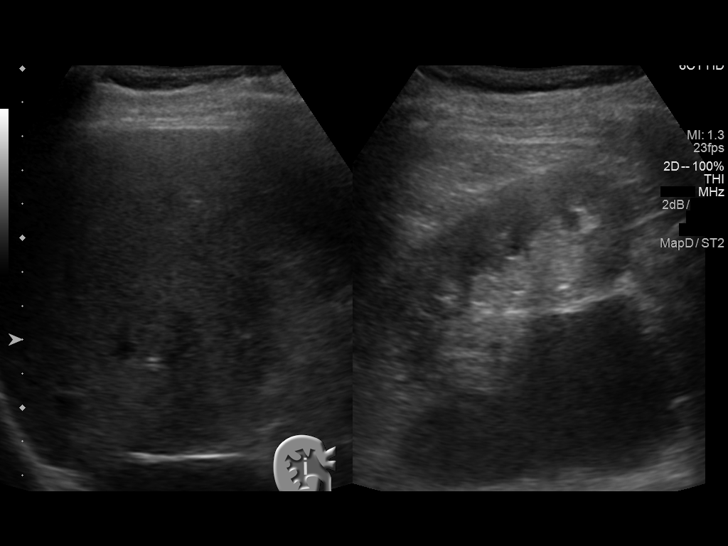
[im 5/49]
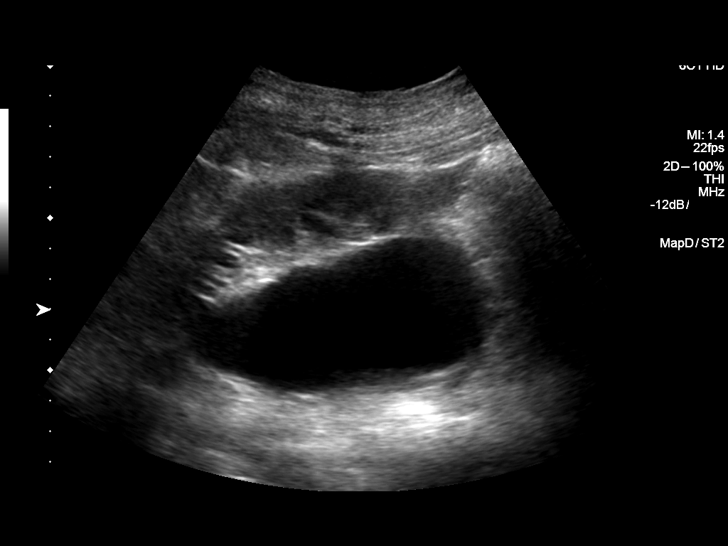
[im 9/49]
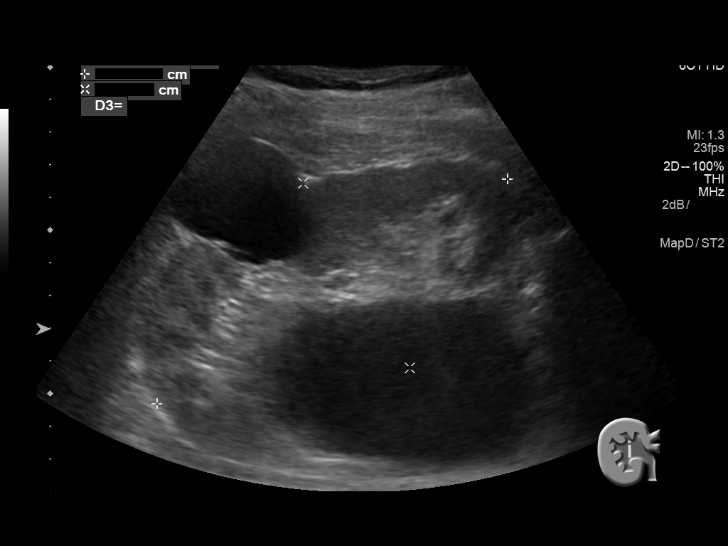
[im 13/49]
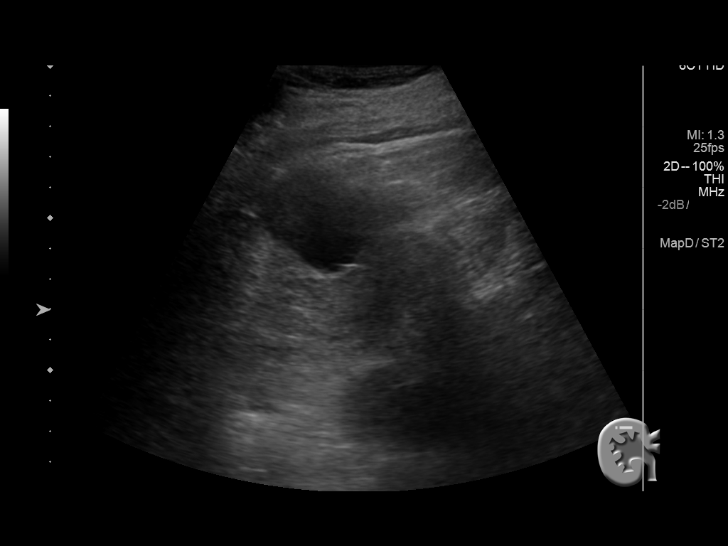
[im 17/49]
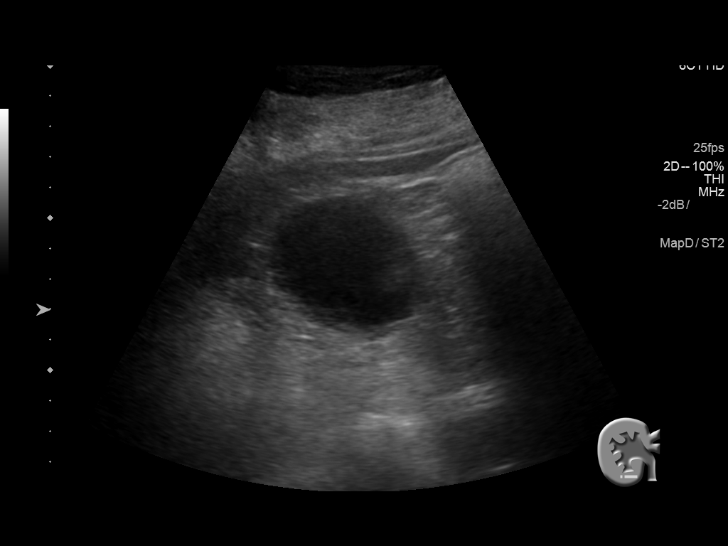
[im 21/49]
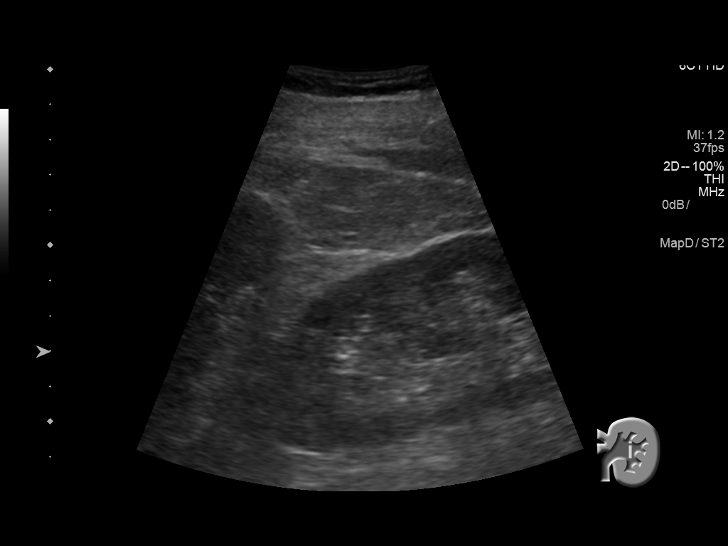
[im 25/49]
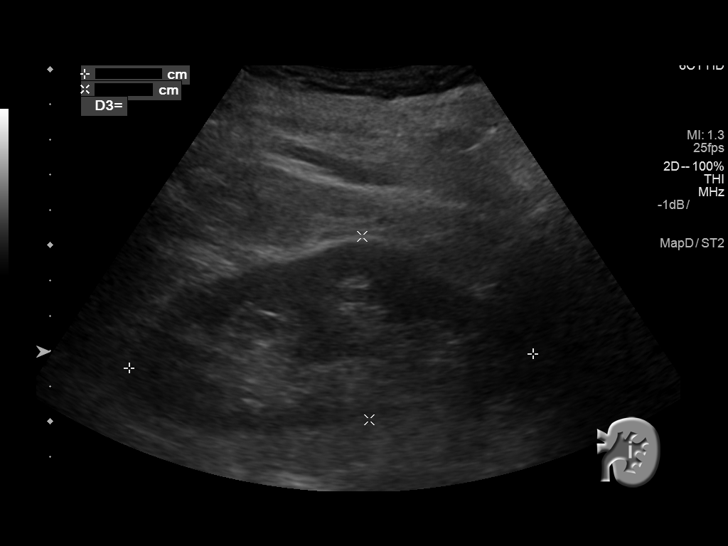
[im 29/49]
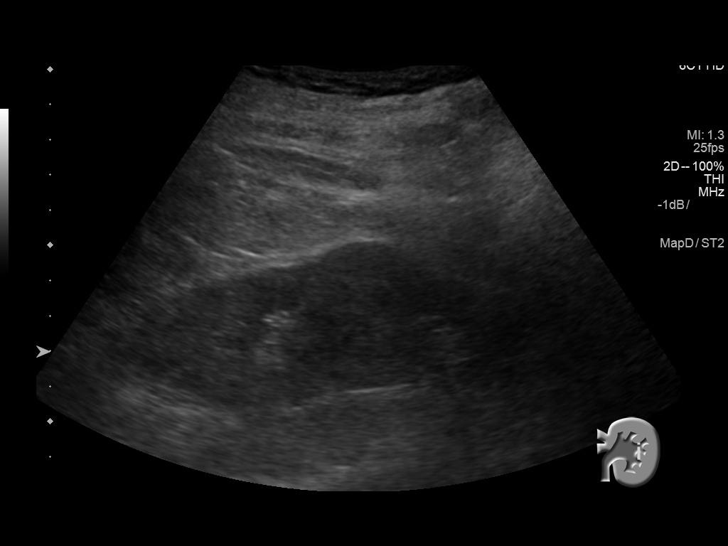
[im 33/49]
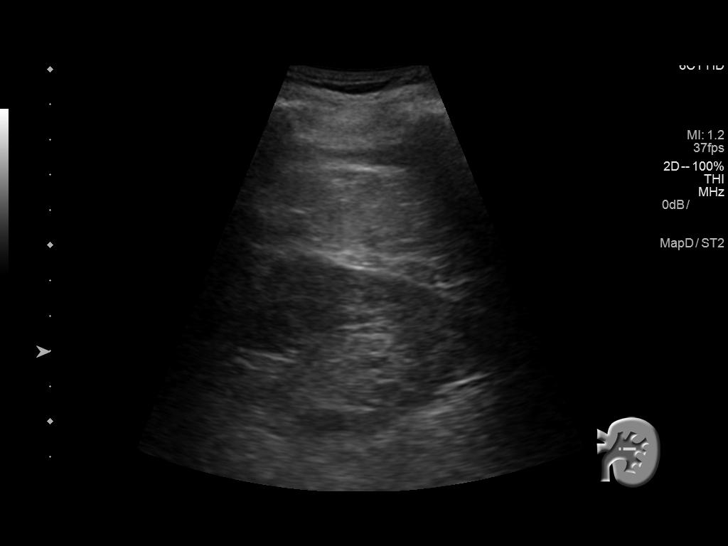
[im 37/49]
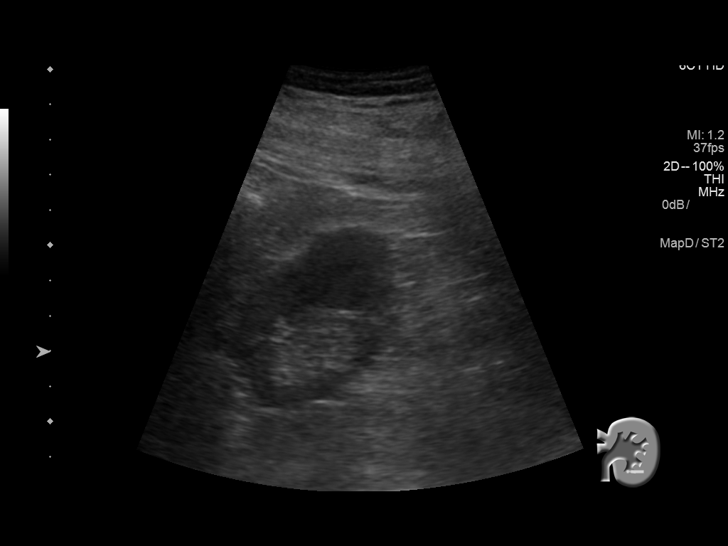
[im 41/49]
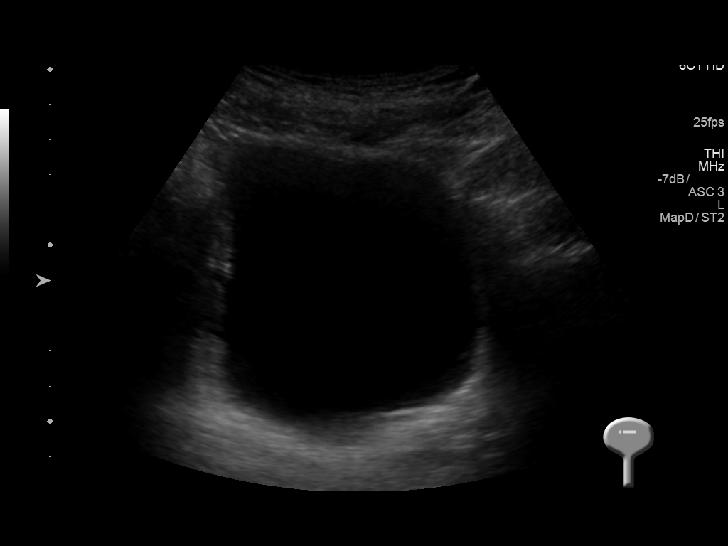
[im 45/49]
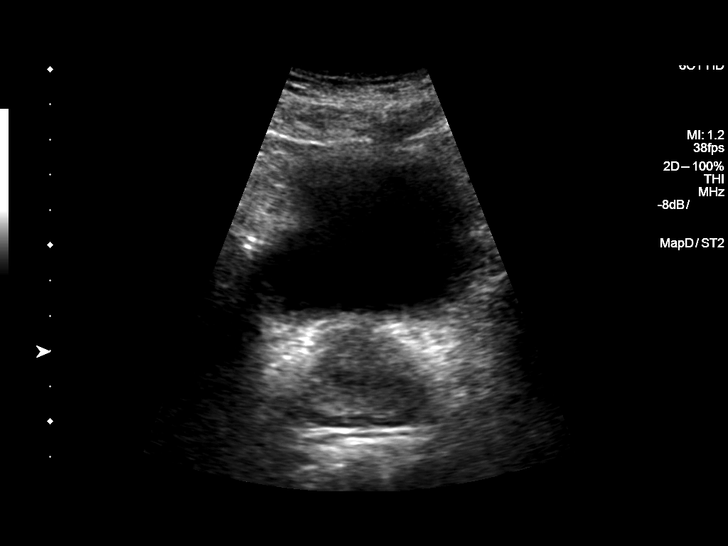
[im 49/49]
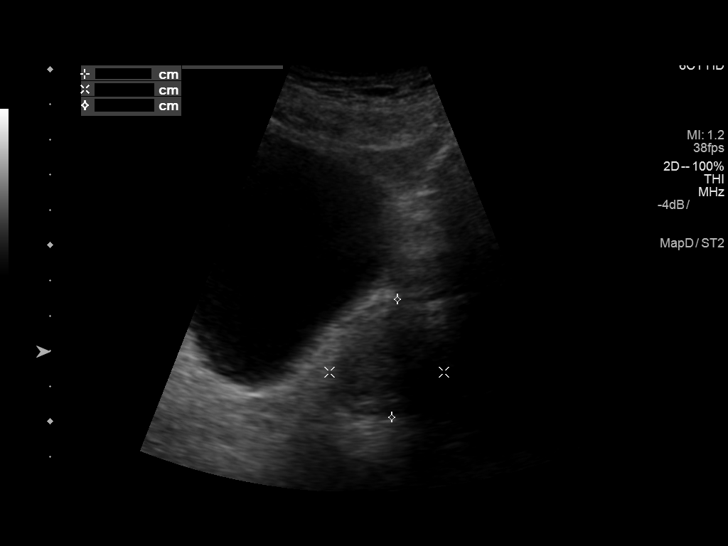

[13 of 25 positions shown; findings below may reference images not displayed]

FINDINGS: Right Kidney:

Renal measurements: 12.7 x 6.5 x 6.5 centimeters = volume: 284 mL.
Cortical echogenicity appears mildly increased relative to the liver
(image 3). There are 2 simple appearing right renal cysts (image 4).
The smaller measures 3.6 centimeters and arises exophytically from
the midpole. The larger measures up to 10.2 centimeters at the right
renal sinus and lower pole. No vascular elements (image 12).

No hydronephrosis or other right renal lesion.

Left Kidney:

Renal measurements: 11.5 x 5.2 x 5.3 centimeters = volume: 167 mL.
Left renal echogenicity appears better preserved. No hydronephrosis.
There is a subtle 10 millimeter simple cyst suspected at the lower
pole (image 24). No other left renal lesion.

Bladder:

Appears normal for degree of bladder distention. Both ureteral jets
detected with Doppler.

Other findings: Prostate size within normal limits for age.
IMPRESSION: 1. Simple and benign renal cysts: 2 on the right, the larger
measuring up to 10.2 cm, and a single small 1 cm cyst on the left.
2. No acute renal findings. Possible chronic right medical renal
disease.
3. Unremarkable urinary bladder.

## 2021-06-02 ENCOUNTER — Other Ambulatory Visit: Payer: Self-pay | Admitting: Specialist

## 2021-06-02 DIAGNOSIS — M25511 Pain in right shoulder: Secondary | ICD-10-CM

## 2021-06-03 ENCOUNTER — Other Ambulatory Visit: Payer: Self-pay | Admitting: Specialist

## 2021-06-03 DIAGNOSIS — M25511 Pain in right shoulder: Secondary | ICD-10-CM

## 2021-06-15 ENCOUNTER — Ambulatory Visit
Admission: RE | Admit: 2021-06-15 | Discharge: 2021-06-15 | Disposition: A | Discharge status: Home / Self Care | Payer: Medicare HMO | Source: Ambulatory Visit | Attending: Specialist | Admitting: Specialist

## 2021-06-15 DIAGNOSIS — M25511 Pain in right shoulder: Secondary | ICD-10-CM

## 2023-03-09 IMAGING — CT CT SHOULDER*R* W/O CM
3 series · 15 of 33 positions shown, 18 images · non-contrast
Comparison: X-ray 05/03/2021

CLINICAL DATA: Right shoulder pain and decreased range of motion
since Monday April, 2021

EXAM:
CT OF THE UPPER RIGHT EXTREMITY WITHOUT CONTRAST
TECHNIQUE: Multidetector CT imaging of the upper right extremity was performed
according to the standard protocol.

[Series 2: shoulder 2.00 br40 s3 axial soft · axial · 0.48mm/px · z∈[-1107,-931]mm · 7 of 104 slices shown, 9 images]
[im 8/104  soft-tissue]
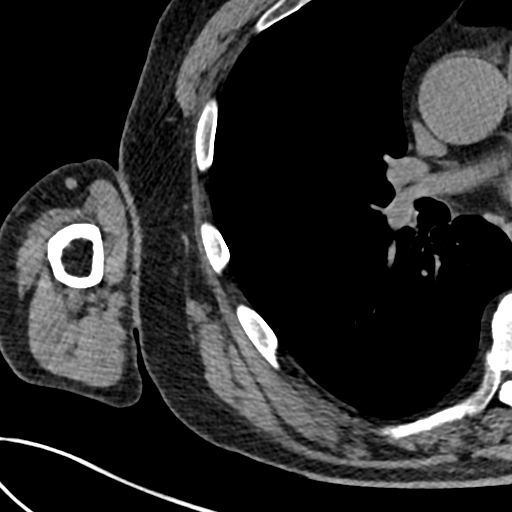
[im 8/104  bone]
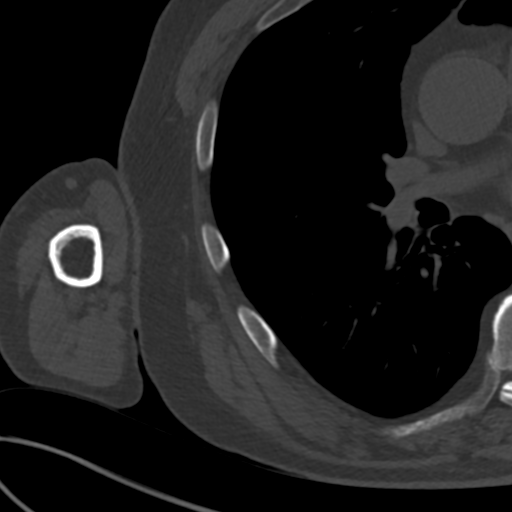
[im 24/104  bone]
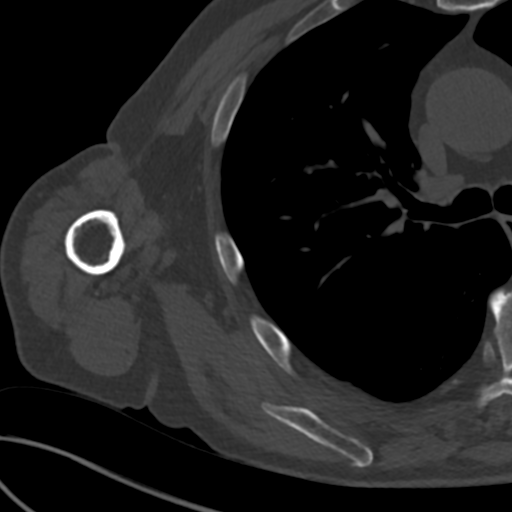
[im 40/104  bone]
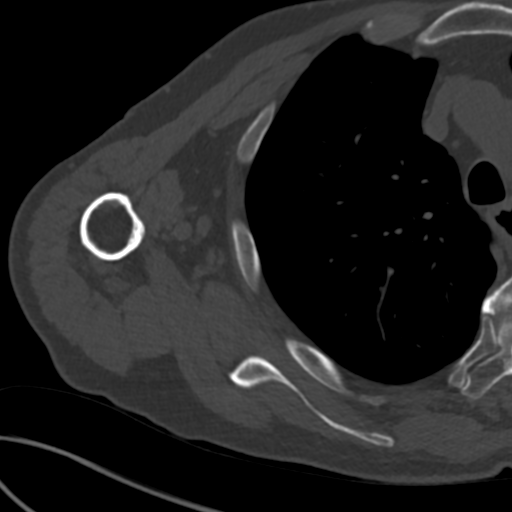
[im 56/104  bone]
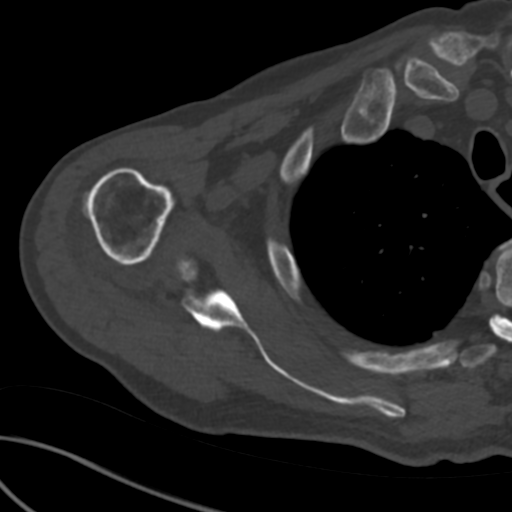
[im 64/104  soft-tissue]
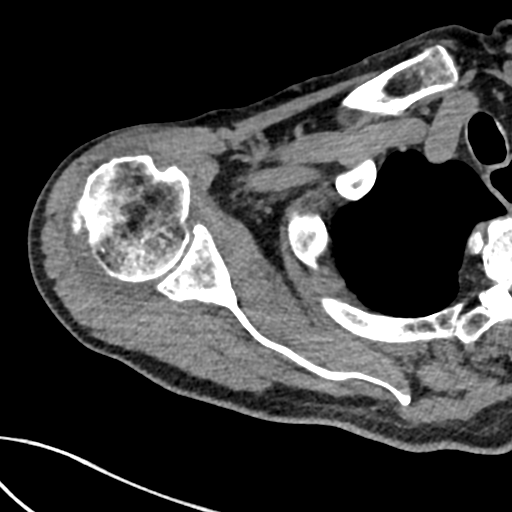
[im 64/104  bone]
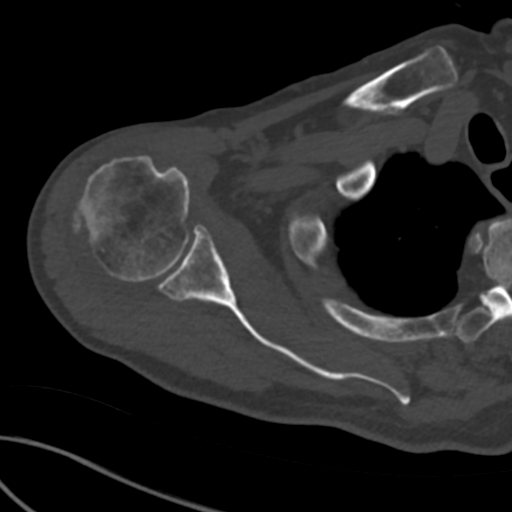
[im 80/104  bone]
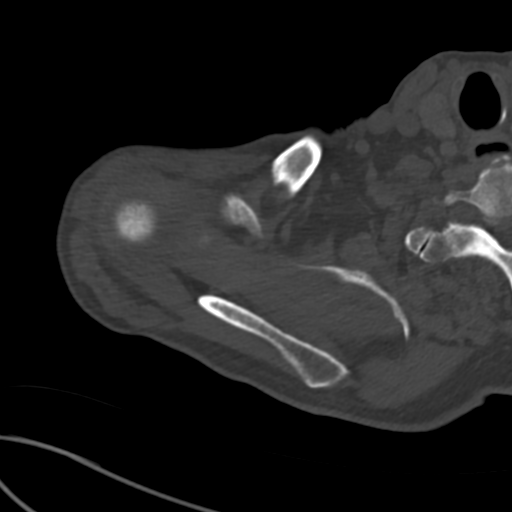
[im 96/104  bone]
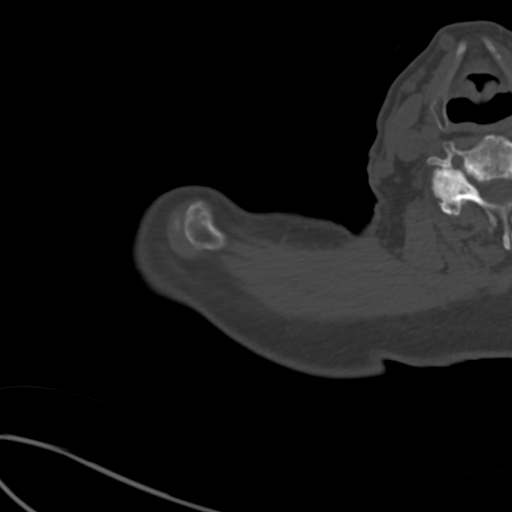

[Series 6: shoulder 2.00 br40 s3 cor soft · coronal · 0.41mm/px · 3 of 123 slices shown]
[im 25/123  bone]
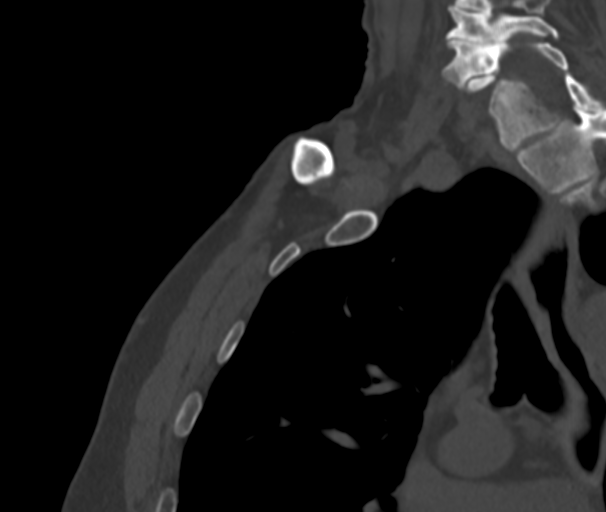
[im 49/123  bone]
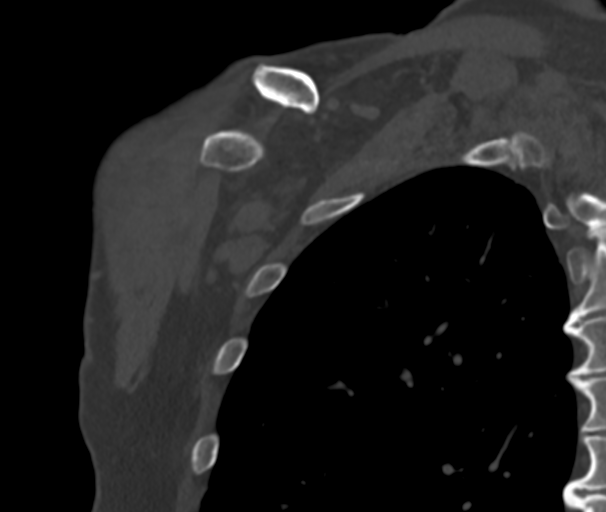
[im 74/123  bone]
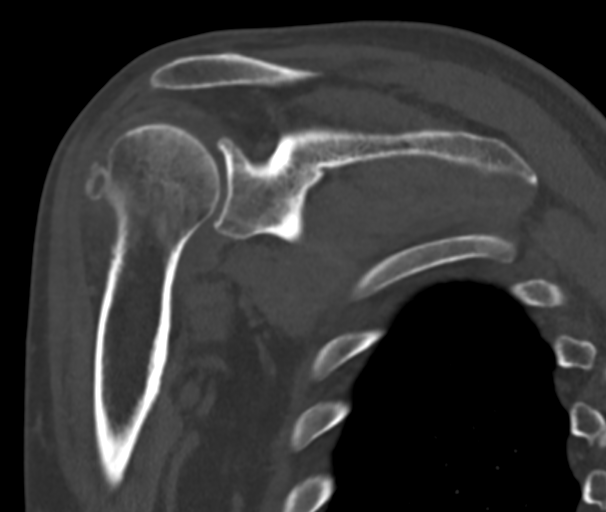

[Series 10: shoulder 2.00 br40 s3 sag soft · sagittal · 0.41mm/px · 5 of 123 slices shown, 6 images]
[im 41/123  bone]
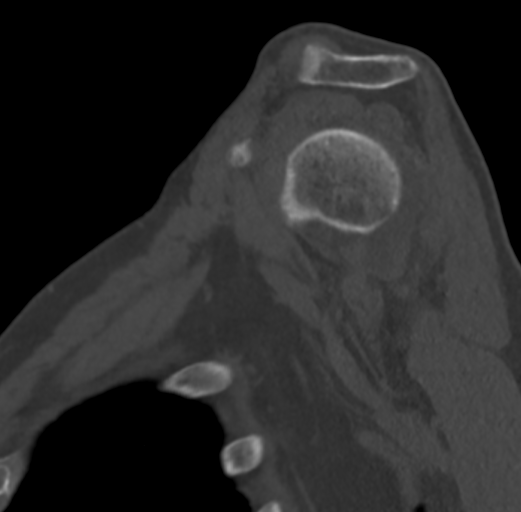
[im 51/123  bone]
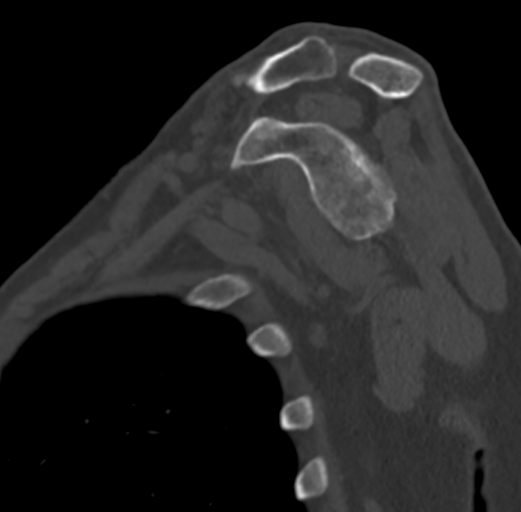
[im 62/123  soft-tissue]
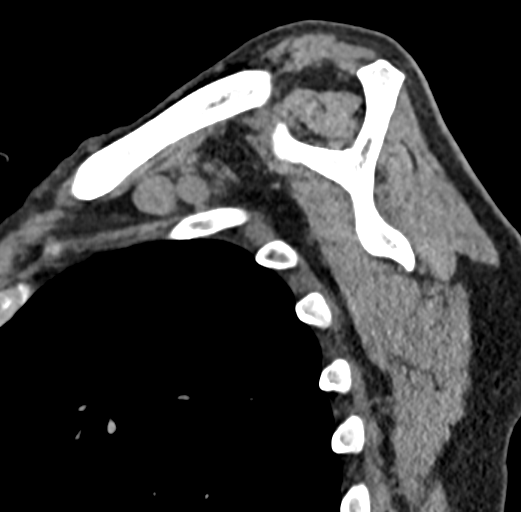
[im 62/123  bone]
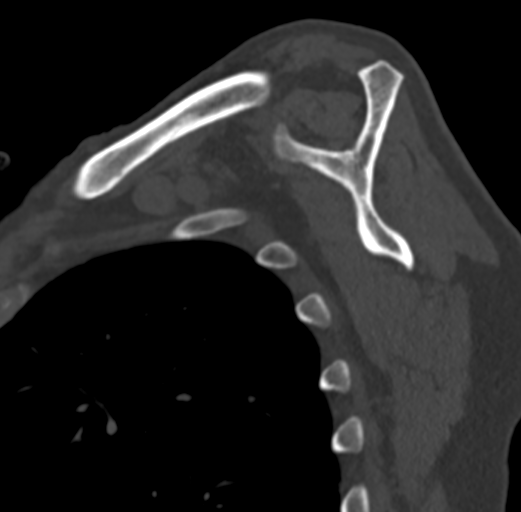
[im 72/123  bone]
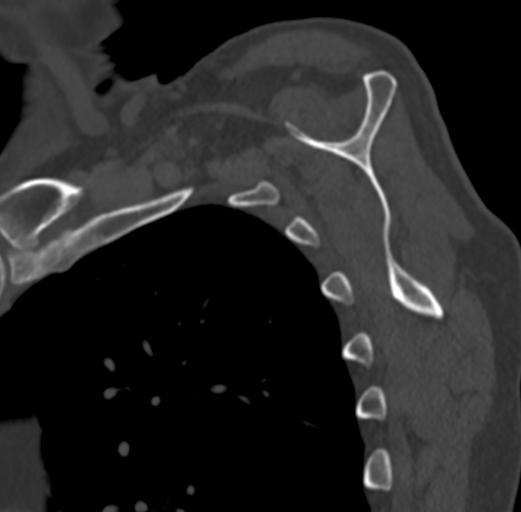
[im 82/123  bone]
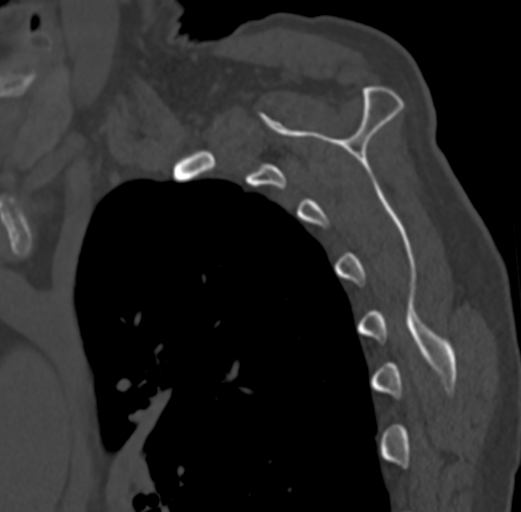

[15 of 33 positions shown; findings below may reference images not displayed]

FINDINGS: Bones/Joint/Cartilage

There is smooth periosteal new bone formation along the lateral
cortex of the proximal humerus at the junction of the greater
tuberosity and humeral neck (series 8, image 68). Probable minimally
displaced fracture fragment with mature bony bridging seen along the
superior margin (series 8, image 73). Appearance suggests healing
late subacute-to-chronic fracture. No residual fracture lucency is
seen. Remaining osseous structures appear intact. No additional
healing fractures. No acute fracture. No malalignment. Moderate
osteoarthritis of the acromioclavicular joint. Mild glenohumeral
joint space narrowing. Probable trace glenohumeral joint effusion.
No suspicious bone lesion.

Ligaments

Suboptimally assessed by CT.

Muscles and Tendons

Rotator cuff appears grossly intact by CT. Preserved rotator cuff
muscle bulk without atrophy or fatty infiltration.

Soft tissues

No edema or fluid collection. No right axillary lymphadenopathy. The
included portion of the right lung field is clear. Ascending
thoracic aorta measures 4.1 cm in diameter.
IMPRESSION: 1. Findings suggestive of a healing late subacute-to-chronic
fracture of the proximal humerus at the greater tuberosity. No
residual fracture lucency is seen.
2. Moderate osteoarthritis of the acromioclavicular joint.
3. Ascending thoracic aortic aneurysm measuring 4.1 cm in diameter.
Recommend annual imaging followup by CTA or MRA. This recommendation
follows 5969 ACCF/AHA/AATS/ACR/ASA/SCA/HEYDAN/BAZAN/TREBOR/SHRADDHA Guidelines
for the Diagnosis and Management of Patients with Thoracic Aortic
Disease. Circulation. 5969; 121: [id number]. Aortic aneurysm NOS
([id number])

## 2023-09-26 ENCOUNTER — Ambulatory Visit: Admit: 2023-09-26 | Payer: Medicare HMO | Admitting: Ophthalmology

## 2023-09-26 SURGERY — PHACOEMULSIFICATION, CATARACT, WITH IOL INSERTION
Anesthesia: Topical | Laterality: Right

## 2023-10-10 ENCOUNTER — Ambulatory Visit: Admit: 2023-10-10 | Payer: Medicare HMO | Admitting: Ophthalmology

## 2023-10-10 SURGERY — PHACOEMULSIFICATION, CATARACT, WITH IOL INSERTION
Anesthesia: Topical | Laterality: Left
